# Patient Record
Sex: Female | Born: 1975 | Race: White | Hispanic: No | Marital: Married | Smoking: Current every day smoker
Health system: Southern US, Community
[De-identification: ages and names within clinical notes are randomized; demographics above are authoritative.]

## PROBLEM LIST (undated history)

## (undated) DIAGNOSIS — I10 Essential (primary) hypertension: Secondary | ICD-10-CM

## (undated) DIAGNOSIS — F419 Anxiety disorder, unspecified: Secondary | ICD-10-CM

## (undated) DIAGNOSIS — IMO0001 Reserved for inherently not codable concepts without codable children: Secondary | ICD-10-CM

## (undated) DIAGNOSIS — F329 Major depressive disorder, single episode, unspecified: Secondary | ICD-10-CM

## (undated) DIAGNOSIS — E7800 Pure hypercholesterolemia, unspecified: Secondary | ICD-10-CM

## (undated) DIAGNOSIS — D649 Anemia, unspecified: Secondary | ICD-10-CM

## (undated) DIAGNOSIS — J45909 Unspecified asthma, uncomplicated: Secondary | ICD-10-CM

## (undated) DIAGNOSIS — K219 Gastro-esophageal reflux disease without esophagitis: Secondary | ICD-10-CM

## (undated) DIAGNOSIS — F32A Depression, unspecified: Secondary | ICD-10-CM

## (undated) DIAGNOSIS — E119 Type 2 diabetes mellitus without complications: Secondary | ICD-10-CM

## (undated) DIAGNOSIS — G4700 Insomnia, unspecified: Secondary | ICD-10-CM

## (undated) HISTORY — DX: Gastro-esophageal reflux disease without esophagitis: K21.9

## (undated) HISTORY — DX: Reserved for inherently not codable concepts without codable children: IMO0001

## (undated) HISTORY — DX: Depression, unspecified: F32.A

## (undated) HISTORY — DX: Major depressive disorder, single episode, unspecified: F32.9

## (undated) HISTORY — DX: Unspecified asthma, uncomplicated: J45.909

## (undated) HISTORY — DX: Essential (primary) hypertension: I10

## (undated) HISTORY — DX: Anxiety disorder, unspecified: F41.9

## (undated) HISTORY — DX: Insomnia, unspecified: G47.00

---

## 2002-04-28 ENCOUNTER — Emergency Department (HOSPITAL_COMMUNITY): Admission: EM | Admit: 2002-04-28 | Discharge: 2002-04-28 | Payer: Self-pay | Admitting: Emergency Medicine

## 2002-04-28 ENCOUNTER — Encounter: Payer: Self-pay | Admitting: Emergency Medicine

## 2002-06-09 ENCOUNTER — Ambulatory Visit (HOSPITAL_COMMUNITY): Admission: RE | Admit: 2002-06-09 | Discharge: 2002-06-09 | Payer: Self-pay | Admitting: Family Medicine

## 2002-06-09 ENCOUNTER — Encounter: Payer: Self-pay | Admitting: Family Medicine

## 2003-02-11 ENCOUNTER — Emergency Department (HOSPITAL_COMMUNITY): Admission: EM | Admit: 2003-02-11 | Discharge: 2003-02-11 | Payer: Self-pay | Admitting: Internal Medicine

## 2004-01-13 ENCOUNTER — Emergency Department (HOSPITAL_COMMUNITY): Admission: EM | Admit: 2004-01-13 | Discharge: 2004-01-13 | Payer: Self-pay | Admitting: Emergency Medicine

## 2004-06-04 ENCOUNTER — Inpatient Hospital Stay (HOSPITAL_COMMUNITY): Admission: RE | Admit: 2004-06-04 | Discharge: 2004-06-05 | Payer: Self-pay | Admitting: Obstetrics & Gynecology

## 2006-09-19 ENCOUNTER — Ambulatory Visit (HOSPITAL_COMMUNITY): Admission: AD | Admit: 2006-09-19 | Discharge: 2006-09-19 | Payer: Self-pay | Admitting: Obstetrics and Gynecology

## 2006-10-19 ENCOUNTER — Ambulatory Visit (HOSPITAL_COMMUNITY): Admission: RE | Admit: 2006-10-19 | Discharge: 2006-10-19 | Payer: Self-pay | Admitting: Obstetrics and Gynecology

## 2006-10-27 ENCOUNTER — Inpatient Hospital Stay (HOSPITAL_COMMUNITY): Admission: AD | Admit: 2006-10-27 | Discharge: 2006-10-30 | Payer: Self-pay | Admitting: Obstetrics and Gynecology

## 2006-10-27 ENCOUNTER — Encounter (INDEPENDENT_AMBULATORY_CARE_PROVIDER_SITE_OTHER): Payer: Self-pay | Admitting: Specialist

## 2006-11-04 ENCOUNTER — Ambulatory Visit (HOSPITAL_COMMUNITY): Admission: RE | Admit: 2006-11-04 | Discharge: 2006-11-04 | Payer: Self-pay | Admitting: Obstetrics and Gynecology

## 2007-02-28 ENCOUNTER — Emergency Department (HOSPITAL_COMMUNITY): Admission: EM | Admit: 2007-02-28 | Discharge: 2007-02-28 | Payer: Self-pay | Admitting: Emergency Medicine

## 2011-02-07 NOTE — H&P (Signed)
[name], [name]               ACCOUNT NO.:  [id number]   MEDICAL RECORD NO.:  [id number]          PATIENT TYPE:  INP   LOCATION:  LDR3                          FACILITY:  [facility]   PHYSICIAN:  Tilda Burrow, M.D. DATE OF BIRTH:  1975/10/27   DATE OF ADMISSION:  10/27/2006  DATE OF DISCHARGE:  LH                              HISTORY & PHYSICAL   ADMITTING DIAGNOSES:  1. Pregnancy 37 +30 weeks.  2. Severe preeclampsia.  3. Repeat cesarean section after trial of labor.  4. History of emergency cesarean section in the past for unexplained      IUGR   HISTORY OF PRESENT ILLNESS:  This 35 year old female, gravida 2, para 1,  AB 0, LMP Feb 07, 2006 placing [facility] November 14, 2006.  Has  had 3 ultrasounds which correspond.  She actually had a 33-week  ultrasound which suggests a 2243-gram infant with good fluid volume at  32 weeks.  She is seen in our office today for a routine office visit  and has had blood pressures go dramatically from 122/84 one week ago to  180/96 in the office, with blood pressures on bedrest in the hospital at  163/99 and one 168/97.  She has reflexes that are 4+ reflexes with 3  beats clonus, with both hyperactivity in the upper and lower  extremities.  She has liver function tests which are normal, denies  headaches, scotoma, right upper quadrant __discomfort_.  Platelets  remain normal at 240,000, and liver enzymes are normal and urine protein  was trace in the office.  Nonetheless, with c-section required, we are  going to proceed with cesarean this afternoon.  Her last food was at  8:00 a.m. this morning, where she ate a heavy breakfast biscuit, etc. at  8:00 a.m.  She has been NPO since that time.  Plans will proceed after 6  hours of NPO status to C-section.   PAST MEDICAL HISTORY:  Benign abdomen, asthma not active this pregnancy.   PAST SURGICAL HISTORY:  Cesarean section, dental extraction.   ALLERGIES:  NONE.   SOCIAL HISTORY:   Married, works as a Diplomatic Services operational officer.  Works in Mining engineer.   PHYSICAL EXAMINATION:  VITAL SIGNS:  Height 5 feet 3 inches, estimated  weight 180.  GENERAL:  Alert, oriented x3.  NECK:  Supple.  CHEST:  Clear to auscultation.  ABDOMEN:  36-cm fundal height.  Estimated fetal weight 6 pounds.  Cervix:  Not checked this time.  Previously closed and high.   PRENATAL LABS:  Blood type A positive.  Urine drug screen negative.  Hemoglobin 14, hematocrit 42.  Hepatitis, HIV, RPR, GC and Chlamydia all  negative.  Group B strep not in current record.   PLAN:  Repeat C-section 2:30 p.m. on October 27, 2006.  Magnesium  sulfate is begun at this time.      Tilda Burrow, M.D.  Electronically Signed     JVF/MEDQ  D:  10/27/2006  T:  10/27/2006  Job:  [id number]   cc:   Francoise Schaumann. Milford Cage DO, FAAP  Fax: [phone]

## 2011-02-07 NOTE — Op Note (Signed)
[name], [name]               ACCOUNT NO.:  [id number]   MEDICAL RECORD NO.:  [id number]          PATIENT TYPE:  INP   LOCATION:  A402                          FACILITY:  [facility]   PHYSICIAN:  Tilda Burrow, M.D. DATE OF BIRTH:  09-Jan-1976   DATE OF PROCEDURE:  10/27/2006  DATE OF DISCHARGE:                               OPERATIVE REPORT   PREOPERATIVE DIAGNOSIS:  Pregnancy 37 weeks 3 days, severe pre-  eclampsia, prior cesarean section, not for trial of labor.   POSTOPERATIVE DIAGNOSIS:  Pregnancy 37 weeks 3 days, severe pre-  eclampsia, prior cesarean section, not for trial of labor, delivered.   PROCEDURE:  Repeat low transverse cervical cesarean section.   SURGEON:  Tilda Burrow, M.D.   ASSISTANTAnnabell Howells, R.N.   ANESTHESIA:  Spinal, Minerva Areola, CRNA   COMPLICATIONS:  None.   FINDINGS:  6 pounds 7 ounces female infant, Apgars 9/9, clear amniotic  fluid.   DETAILS OF PROCEDURE:  The patient was taken to the operating room,  prepped and draped with a Foley catheter in place.  A Pfannenstiel type  incision was performed without difficulty with excision of the old  cicatrix.  The fascia was opened transversely and the peritoneal cavity  opened easily.  There was a small amount of omental adhesions to the  anterior abdominal wall from prior surgery.  These were released.  The  bladder flap was developed easily on the lower uterine segment and a  transverse uterine incision made in the lower uterine segment and  extended laterally using index finger traction and then the fetal vertex  guided through the incision with fundal pressure used as the primary  propulsive force.   The uterus was irrigated with antibiotic solution.  The placenta was  delivered intact, Tomasa Blase presentation.  A single layer running locking  closure of the uterine cavity was followed by 2-0 chromic closure of the  bladder flap.  The  anterior abdominal wall was reapproximated layer by layer  with  subcutaneous 2-0 Dexon used to close the subcutaneous space and 0 Dexon  used to close the fascia.  The patient tolerated the procedure well with  JP drain placed in the maternal subcutaneous space and was stable for  discharge in stable condition.      Tilda Burrow, M.D.  Electronically Signed     JVF/MEDQ  D:  10/27/2006  T:  10/27/2006  Job:  [id number]   cc:   Jeoffrey Massed, MD  Fax: [phone]

## 2011-02-07 NOTE — Op Note (Signed)
NAME:  [name], [name]                         ACCOUNT NO.:  [id number]   MEDICAL RECORD NO.:  [id number]                   PATIENT TYPE:  INP   LOCATION:  LDR1                                 FACILITY:  [facility]   PHYSICIAN:  Tilda Burrow, M.D.              DATE OF BIRTH:  September 22, 1976   DATE OF PROCEDURE:  06/04/2004  DATE OF DISCHARGE:                                 OPERATIVE REPORT   PREOPERATIVE DIAGNOSIS:  Pregnancy, [redacted] weeks gestation, intrauterine growth  retardation, uncertain fetal status, oligohydramnios.   POSTOPERATIVE DIAGNOSIS:  Pregnancy, [redacted] weeks gestation, intrauterine growth  retardation, uncertain fetal status, oligohydramnios.   PROCEDURE:  Primary low transverse cervical Cesarean section emergency.   SURGEON:  Tilda Burrow, M.D.   ASSISTANT:  __________ .   ANESTHESIA:  Tyrone Nine, CNRA.   COMPLICATIONS:  None.   ESTIMATED BLOOD LOSS:  400.   CONDITION:  In the recovery room good.   FINDINGS:  Small for gestational age infant, absent amniotic fluid, no  evidence of meconium. Apgars 9 and 9.   INDICATIONS:  A 35 year old female found to have intrauterine birth  retardation and being stabilized for consideration of transfer who on  monitoring showed spontaneously prolonged decelerations, presumed to be  late, admitted for emergency Cesarean section because of __________ .  The  patient was noted to have prolonged decelerations, and decision made by Dr.  Despina Hidden and myself to proceed with emergency Cesarean section with stat  Cesarean section called for patient. The patient was taken promptly to the  operating room as soon as blood had been obtained for CBC, type and cross  and then was rapidly prepped for Cesarean section delivery. After general  anesthesia obtained and endotracheal intubation, a Pfannenstiel type  incision was performed. The fetus was monitored until 3 minutes before the  incision with external monitoring confirming that the fetal  heart rate was  in the normal range immediately preceding the procedure and oxygen was in  place. Pfannenstiel incision was performed without difficulty. Bladder  developed on the well developed lower uterine segment, and a transverse  incision made in the uterus with great care, carefully reaching the level of  the fetal vertex and extending the incision transversely using index finger  traction. The fetal vertex was easily rotated into the incision and  delivered by funda pressure. The majority of the cord was sent with the  infant with Dr. Milford Cage. Cord blood gas was obtained from the base of the  placenta from an arterial sample. The placenta was delivered intact, Tomasa Blase  presentation, with placenta sent for histology. There was no malodor to the  amniotic cavity.   The uterus was irrigated with antibiotic solution. Single layer of running  locking closure performed. Prior to closure of the uterus, we had made note  that the placental attachment in the left cornual area of the uterus  appeared grossly normal to  inspection. The fundus of the uterus was  similarly inspected and found to have normal tubes bilaterally and ovaries  bilaterally.   The closure of the uterus with single layer running locking 0 chromic  followed by bladder flap reapproximation with continuous running 2-0  chromic. Abdominal cavity irrigation with antibiotic containing solution was  followed by closure of the peritoneum with chromic suture, closure of the  fascia with 0 Vicryl suture, and then reapproximation of the subcu fatty  tissue with 2-0 plain sutures and then staple closure of the skin to  complete the procedure. The estimated blood loss was quite low at 400 cc  with patient going to recovery room where she awakened in stable condition.      ___________________________________________                                            Tilda Burrow, M.D.   JVF/MEDQ  D:  06/04/2004  T:  06/04/2004  Job:   [id number]   cc:   Francoise Schaumann. Halm, D.O.  [address]  [place]  [place] [address]  Fax: [phone]

## 2011-02-07 NOTE — Discharge Summary (Signed)
[name], [name]               ACCOUNT NO.:  [id number]   MEDICAL RECORD NO.:  [id number]          PATIENT TYPE:  INP   LOCATION:  A402                          FACILITY:  [facility]   PHYSICIAN:  Tilda Burrow, M.D. DATE OF BIRTH:  04/22/1976   DATE OF ADMISSION:  10/27/2006  DATE OF DISCHARGE:  02/08/2008LH                               DISCHARGE SUMMARY   ADMISSION DIAGNOSES:  1. Severe preeclampsia.  2. Pregnancy at 37 weeks' gestation, for repeat cesarean section, not      for labor.   DISCHARGE DIAGNOSES:  1. Pregnancy at 37 weeks, delivered, repeat cesarean section.  2. Severe preeclampsia, improved.   HISTORY OF PRESENT ILLNESS:  This 35 year old female, gravida 2, para 1,  AB 0, LMP May 19, is admitted after being seen in the office with blood  pressures 163/99 to 168/97, with 4+ reflexes, without headaches,  scotomata, or right upper quadrant pain.  Additionally, the patient had  normal platelets.   HOSPITAL COURSE:  The patient was admitted at 33 weeks' gestation by  multiple criteria.  She had increasing blood pressure from 122/84 one  week ago to 180/96 in the office, with blood pressures 163/99 and 168/96  without sequelae noted in labor and delivery.  Liver function tests were  normal.  No headaches, scotomata or right upper quadrant pain.  The  patient was admitted for C-section this evening after presenting with  dramatically elevated blood pressures to 163/99 to 168/97 with 4+  reflexes.  No headache, scotomata or right upper quadrant pain or  discomfort.  Normal platelets.  Medical history benign with asthma not  active this pregnancy, C-section and a dental extraction the only  surgical histories.   The patient was admitted, placed on magnesium sulfate.  The decision was  made to go ahead and proceed with a repeat cesarean section the day of  admission.  Labs were drawn, showing her blood type B positive.  The  patient was admitted for cesarean section  after magnesium sulfate is  initiated.   Labs upon admission include hemoglobin 10.3, hematocrit 30.4, white  count 11,900, with total protein 5.9, low potassium of 2.4, and  hemoglobin and hematocrit stable.  The patient had a C-section,  delivering a healthy infant without difficulty and was without major  sequelae.   Postoperatively the patient did well with postoperative hemoglobin 10.6,  hematocrit 31.3, actually higher than preop values.  Electrolytes were  within normal limits with potassium 2.4.  The patient has remained  stable, will be discharged home for follow-up in our office.   ADDENDUM:  Interestingly, upon reaching the office we discovered that  [name] was running an occult evidence of renal dysfunction because BUN is  greater than 10 times the creatinine and creatinine levels have been  approximately 1.2, atypical for her young, otherwise healthy age group.      Tilda Burrow, M.D.  Electronically Signed     JVF/MEDQ  D:  11/27/2006  T:  11/27/2006  Job:  [id number]

## 2011-02-07 NOTE — Discharge Summary (Signed)
[name], [name]               ACCOUNT NO.:  [id number]   MEDICAL RECORD NO.:  [id number]          PATIENT TYPE:  INP   LOCATION:  A412                          FACILITY:  [facility]   PHYSICIAN:  Tilda Burrow, M.D. DATE OF BIRTH:  [birth year]   DATE OF ADMISSION:  06/04/2004  DATE OF DISCHARGE:  09/14/2005LH                                 DISCHARGE SUMMARY   ADMITTING DIAGNOSES:  1.  Pregnancy 35 weeks, 2 days.  2.  Severe intrauterine growth restriction.  3.  Severe oligohydramnios.  4.  Unfavorable cervix.  5.  Uncertain fetal status.   DISCHARGE DIAGNOSES:  1.  Pregnancy, 35 weeks, 2 days, delivered.  2.  Severe intrauterine growth restriction.  3.  Severe oligohydramnios.  4.  Uncertain fetal status.   PROCEDURE:  1.  Obstetric ultrasound on June 04, 2004, unsuccessful biophysical      profile on June 04, 2004.  2.  Nonreactive NST.  3.  Positive CST on June 04, 2004.  Primary low-transverse cervical      cesarean section, Dr. Tilda Burrow, June 04, 2004.   HISTORY OF PRESENT ILLNESS:  This 35 year old female, gravida 1, para 0,  admitted at 35 weeks, 2 days by reliable criteria with unremarkable prenatal  care. She has been seen at 32-1/[redacted] weeks gestation.  She had a fundal height  of 31 cm by abdominal exam.  On followup office visit at 35 weeks, 2 days,  she remained at 31 cm, and ultrasound was performed due to size less than  dates revealing an estimated fetal weight of 1500 grams with absence of any  identifiable pockets of amniotic fluid.  Cord was unable to be isolated.  The decision was made to do Doppler-flow studies.  The patient was prepared  for transfer to [facility] for delivery.   PAST MEDICAL HISTORY:  Unfortunately, the patient's transfer was not  immediately possible due to space limitations at [facility], so she was kept on  the monitor.  During her monitoring, she evidence of uncertain fetal status  with recurrent,  prolonged decelerations without adequate beat-to-beat  variability, suggesting spontaneous decelerations.  She was taken therefore  for stat cesarean section.   HOSPITAL COURSE:  The patient's emergency delivery revealed a 2-pound, 10-  ounce infant.  Postoperatively, the patient did amazingly well.  She had  minimal blood loss at the time of delivery.  Placenta was sent to pathology.  The baby was transferred to [facility] for continued care.  The infant  had Apgars of 9 and 9 and was stable on room air, but was transferred for  continued monitoring and supervision.  The maternal hemoglobin was 12,  hematocrit 33 postoperatively.  This compared with admitting hemoglobin of  13 and hematocrit of 36.8.  Maternal blood type was A positive.   Postoperatively, the patient did well, and was stable for discharge at 24  hours to visit her baby, to follow up three days later for incision check  and staple removal in our office.     John   JVF/MEDQ  D:  06/10/2004  T:  06/10/2004  Job:  [id number]

## 2011-02-07 NOTE — H&P (Signed)
NAME:  NADYNE, GARIEPY                         ACCOUNT NO.:  [id number]   MEDICAL RECORD NO.:  [id number]                   PATIENT TYPE:  OIB   LOCATION:  LDR1                                 FACILITY:  [facility]   PHYSICIAN:  Lazaro Arms, M.D.                DATE OF BIRTH:  Feb 06, 1976   DATE OF ADMISSION:  06/04/2004  DATE OF DISCHARGE:                                HISTORY & PHYSICAL   HISTORY OF PRESENT ILLNESS:  [name] is a 35 year old white female, gravida 1,  para 0, estimated date of delivery of July 07, 2004, by sure last  menstrual period and a confirmatory 8 week 4 day crown/rump length,  currently at 35-2/[redacted] weeks gestation.  The patient has had unremarkable  prenatal care up to this point.  She does smoke approximately 10 cigarettes  a day, but otherwise has had a very unremarkable pregnancy.  She presented  to the office yesterday with fundal height of 31.  When she was seen at 32-  1/2 weeks, she had a fundal height of 31 also.  As a result, without any  history of remotely appearing like ruptured membranes, I ordered an  ultrasound for today which reveals an estimated fetal weight of about 1500 g  and no discernible pockets of amniotic fluid.  A Doppler flow study could  not be done due to lack of fluid and just could not isolate any cord at all  to do the study.  As a result, the patient is informed of this as is her  family.  At 35 weeks and no amniotic fluid with an estimated fetal weight of  less than 4 pounds, I think it prudent to transfer the patient to [facility] where a neonatal intensive care unit is available for immediate  significant ventilatory support.  The patient and her family agree with  this.  I talked with Dr. Gavin Potters over at [facility] and he is certainly glad to  accept the patient in transfer.   PAST MEDICAL HISTORY:  Significant for gastrointestinal reflux disease.   PAST SURGICAL HISTORY:  Negative.   PAST OBSTETRICAL HISTORY:  She is  nulliparous.   ALLERGIES:  None.   MEDICATIONS:  Antacids and H2 blockers sporadically throughout the  pregnancy.   REVIEW OF SYSTEMS:  Otherwise negative.   SOCIAL HISTORY:  The patient works at a [employer].  She does smoke  about 10 cigarettes a day.   LABORATORY DATA:  Blood type is A positive.  The urine drug screen was  negative.  Antibody screen was negative.  Rubella is immune.  Hepatitis B  was negative.  HIV was nonreactive.  Serology was nonreactive.  Pap is  normal.  GC and chlamydia were negative.  AFP at 16 weeks was normal.  Her  group B Streptococcus was obtained yesterday and is not back yet, as was a  repeat GC and chlamydia.  Her Glucola was elevated at 161, but the four  values were 82, 163, 147 and 111, given a normal three-hour GTT.  Her last  hemoglobin and hematocrit at 28 weeks was 13 and 40, respectively.   PHYSICAL EXAMINATION:  HEENT:  Unremarkable.  LUNGS:  Clear.  HEART:  Regular rhythm without murmur, regurgitation or gallop.  BREASTS:  Deferred.  ABDOMEN:  Fundal height 31 cm.  PELVIC:  The cervix is long, thick, closed, thick posterior and vertex.  EXTREMITIES:  Warm with no edema.  NEUROLOGIC:  Grossly intact.   IMPRESSION:  1.  Intrauterine pregnancy at 35-2/[redacted] weeks gestation.  2.  Severe intrauterine growth restriction.  3.  Severe oligohydramnios.  4.  Unfavorable cervix.   PLAN:  The patient is transferred to maternal fetal medicine care, Conni Elliot, M.D., at [facility].  There is room for the baby in the  neonatal intensive care unit as well.  The patient and the family understand  and agree with the plan of care.  External fetal monitor here in labor and  delivery has been reassuring with no evidence of any cord compression or  variable decelerations.     ___________________________________________                                         Lazaro Arms, M.D.   Loraine Maple  D:  06/04/2004  T:  06/04/2004  Job:   [id number]

## 2013-01-26 ENCOUNTER — Emergency Department (HOSPITAL_COMMUNITY)
Admission: EM | Admit: 2013-01-26 | Discharge: 2013-01-26 | Disposition: A | Discharge status: Home / Self Care | Payer: 59 | Attending: Emergency Medicine | Admitting: Emergency Medicine

## 2013-01-26 ENCOUNTER — Encounter: Payer: Self-pay | Admitting: *Deleted

## 2013-01-26 ENCOUNTER — Encounter (HOSPITAL_COMMUNITY): Payer: Self-pay

## 2013-01-26 DIAGNOSIS — I10 Essential (primary) hypertension: Secondary | ICD-10-CM | POA: Insufficient documentation

## 2013-01-26 DIAGNOSIS — Y9389 Activity, other specified: Secondary | ICD-10-CM | POA: Insufficient documentation

## 2013-01-26 DIAGNOSIS — Z8719 Personal history of other diseases of the digestive system: Secondary | ICD-10-CM | POA: Insufficient documentation

## 2013-01-26 DIAGNOSIS — IMO0002 Reserved for concepts with insufficient information to code with codable children: Secondary | ICD-10-CM | POA: Insufficient documentation

## 2013-01-26 DIAGNOSIS — J45909 Unspecified asthma, uncomplicated: Secondary | ICD-10-CM | POA: Insufficient documentation

## 2013-01-26 DIAGNOSIS — S46911A Strain of unspecified muscle, fascia and tendon at shoulder and upper arm level, right arm, initial encounter: Secondary | ICD-10-CM

## 2013-01-26 DIAGNOSIS — F3289 Other specified depressive episodes: Secondary | ICD-10-CM | POA: Insufficient documentation

## 2013-01-26 DIAGNOSIS — X500XXA Overexertion from strenuous movement or load, initial encounter: Secondary | ICD-10-CM | POA: Insufficient documentation

## 2013-01-26 DIAGNOSIS — Z79899 Other long term (current) drug therapy: Secondary | ICD-10-CM | POA: Insufficient documentation

## 2013-01-26 DIAGNOSIS — F411 Generalized anxiety disorder: Secondary | ICD-10-CM | POA: Insufficient documentation

## 2013-01-26 DIAGNOSIS — F17200 Nicotine dependence, unspecified, uncomplicated: Secondary | ICD-10-CM | POA: Insufficient documentation

## 2013-01-26 DIAGNOSIS — F329 Major depressive disorder, single episode, unspecified: Secondary | ICD-10-CM | POA: Insufficient documentation

## 2013-01-26 DIAGNOSIS — Y929 Unspecified place or not applicable: Secondary | ICD-10-CM | POA: Insufficient documentation

## 2013-01-26 MED ORDER — CYCLOBENZAPRINE HCL 10 MG PO TABS: 10.0000 mg | ORAL_TABLET | Freq: Two times a day (BID) | ORAL | Status: DC | PRN

## 2013-01-26 MED ORDER — DEXAMETHASONE SODIUM PHOSPHATE 4 MG/ML IJ SOLN
10.0000 mg | Freq: Once | INTRAMUSCULAR | Status: AC
  Administered 2013-01-26: 10 mg via INTRAMUSCULAR
  Filled 2013-01-26: qty 3

## 2013-01-26 MED ORDER — HYDROCODONE-ACETAMINOPHEN 5-325 MG PO TABS: 1.0000 | ORAL_TABLET | ORAL | Status: DC | PRN

## 2013-01-26 MED ORDER — HYDROCODONE-ACETAMINOPHEN 5-325 MG PO TABS
1.0000 | ORAL_TABLET | Freq: Once | ORAL | Status: AC
  Administered 2013-01-26: 1 via ORAL
  Filled 2013-01-26: qty 1

## 2013-01-26 NOTE — ED Provider Notes (Signed)
History     CSN: [id number]  Arrival date & time 01/26/13  1416   First MD Initiated Contact with Patient 01/26/13 1445      Chief Complaint  Patient presents with  . Arm Pain  . Back Pain    (Consider location/radiation/quality/duration/timing/severity/associated sxs/prior treatment) HPI [name] is a 37 y.o. female who presents to the ED with right shoulder pain. The pain started 3 days ago after she flipped her head over to brush her hair and had a sudden onset of sharp pain. The pain increases with movement. The history was provided by the patient.  Past Medical History  Diagnosis Date  . Hypertension   . Reflux   . Depression   . Anxiety   . Asthma   . Insomnia     Past Surgical History  Procedure Laterality Date  . Cesarean section      No family history on file.  History  Substance Use Topics  . Smoking status: Current Every Day Smoker  . Smokeless tobacco: Not on file  . Alcohol Use: No    OB History   Grav Para Term Preterm Abortions TAB SAB Ect Mult Living                  Review of Systems  Constitutional: Negative for fever and chills.  HENT: Negative for neck pain.   Eyes: Negative for visual disturbance.  Respiratory: Negative for cough.   Gastrointestinal: Negative for abdominal pain.  Musculoskeletal: Positive for back pain.       Right shoulder pain  Skin: Negative for wound.  Neurological: Negative for headaches.  Psychiatric/Behavioral: The patient is not nervous/anxious.     Allergies  Review of patient's allergies indicates no known allergies.  Home Medications   Current Outpatient Rx  Name  Route  Sig  Dispense  Refill  . ALPRAZolam (XANAX) 0.5 MG tablet   Oral   Take 0.5 mg by mouth at bedtime as needed for sleep.         Marland Kitchen FLUoxetine (PROZAC) 20 MG tablet   Oral   Take 20 mg by mouth daily.           BP 143/86  Pulse 114  Temp(Src) 98.1 F (36.7 C) (Oral)  Resp 20  Ht 5\' 2"  (1.575 m)  Wt 168 lb  (76.204 kg)  BMI 30.72 kg/m2  SpO2 98%  LMP 01/05/2013  Physical Exam  Nursing note and vitals reviewed. Constitutional: She is oriented to person, place, and time. She appears well-developed and well-nourished. No distress.  HENT:  Head: Normocephalic.  Eyes: EOM are normal.  Neck: Normal range of motion. Neck supple.  Cardiovascular: Normal rate and regular rhythm.   Pulmonary/Chest: Effort normal and breath sounds normal.  Musculoskeletal: Normal range of motion.       Right shoulder: She exhibits tenderness, pain and spasm. She exhibits normal range of motion, no swelling, no effusion, no deformity, no laceration, normal pulse and normal strength.       Arms: Tender on palpation of right shoulder. Full range of motion without difficulty. Radial pulse present and strong. Good touch sensation, adequate circulation.  Neurological: She is alert and oriented to person, place, and time. She has normal strength. No cranial nerve deficit or sensory deficit.  Skin: Skin is warm and dry.  Psychiatric: She has a normal mood and affect. Her behavior is normal. Judgment and thought content normal.    ED Course  Procedures (including critical care  time) Assessment: 37 y.o. female with right shoulder pain    Probably muscle strain  Plan:  Pain management, ice, rest follow up with Ortho, return as needed  MDM  Discussed with the patient clinical findings and need for follow up. All questioned fully answered.   Medication List    TAKE these medications       cyclobenzaprine 10 MG tablet  Commonly known as:  FLEXERIL  Take 1 tablet (10 mg total) by mouth 2 (two) times daily as needed for muscle spasms.     HYDROcodone-acetaminophen 5-325 MG per tablet  Commonly known as:  NORCO/VICODIN  Take 1 tablet by mouth every 4 (four) hours as needed.      ASK your doctor about these medications       ALPRAZolam 0.5 MG tablet  Commonly known as:  XANAX  Take 0.5 mg by mouth at bedtime as  needed for sleep.     FLUoxetine 20 MG tablet  Commonly known as:  PROZAC  Take 20 mg by mouth daily.               Janne Napoleon, [place] 01/26/13 [phone]

## 2013-01-26 NOTE — ED Notes (Signed)
Pt reports Monday she flipped her head over to brush her hair and had sudden onset of sharp pain in r shoulder, breast, arm, and back.  Reports pain is much worse with movement.  Says neither ice or heat helps.

## 2013-01-27 ENCOUNTER — Ambulatory Visit: Payer: Self-pay | Admitting: Family Medicine

## 2013-01-31 NOTE — ED Provider Notes (Signed)
Medical screening examination/treatment/procedure(s) were performed by non-physician practitioner and as supervising physician I was immediately available for consultation/collaboration.   Kristal Perl W. Kyera Felan, MD 01/31/13 0515 

## 2013-02-25 ENCOUNTER — Ambulatory Visit (INDEPENDENT_AMBULATORY_CARE_PROVIDER_SITE_OTHER): Payer: 59 | Admitting: Family Medicine

## 2013-02-25 ENCOUNTER — Encounter: Payer: Self-pay | Admitting: Family Medicine

## 2013-02-25 VITALS — BP 130/88 | HR 80 | Wt 178.0 lb

## 2013-02-25 DIAGNOSIS — M25511 Pain in right shoulder: Secondary | ICD-10-CM

## 2013-02-25 DIAGNOSIS — M25519 Pain in unspecified shoulder: Secondary | ICD-10-CM

## 2013-02-25 MED ORDER — CHLORZOXAZONE 500 MG PO TABS: 500.0000 mg | ORAL_TABLET | Freq: Three times a day (TID) | ORAL | Status: DC | PRN

## 2013-02-25 MED ORDER — DICLOFENAC SODIUM 75 MG PO TBEC: 75.0000 mg | DELAYED_RELEASE_TABLET | Freq: Two times a day (BID) | ORAL | Status: DC

## 2013-02-25 NOTE — Progress Notes (Signed)
  Subjective:    Patient ID: [name], female    DOB: 12-15-1975, 37 y.o.   MRN: [id number]  Shoulder Pain  The pain is present in the right shoulder. This is a recurrent problem. The current episode started more than 1 month ago. The problem occurs constantly. The problem has been gradually worsening. The quality of the pain is described as sharp. The pain is at a severity of 6/10. The pain is moderate. The symptoms are aggravated by activity. She has tried nothing for the symptoms. The treatment provided moderate relief.   In pain extends from the lateral neck into the shoulder and down to the the deltoid region. Worse with certain motions. Feels tight at times. Seen in ER and given prescription for Flexeril and small number of hydrocodone  Review of Systems    no chest pain no abdominal pain no shortness of breath ROS otherwise negative Objective:   Physical Exam  Alert no major distress. HEENT normal. Lungs clear. Heart regular rate and rhythm. Shoulder good range of motion no true impingement sign. Positive pain with rotation. Positive suprascapular tenderness positive deltoid tenderness. Distal grip strength sensation intact. Positive pain with rotation of neck.      Assessment & Plan:  Impression diffuse shoulder girdle strain. Highly doubt intra-articular or cervical disc problems. Discussed. With patient. Plan chlorzoxazone 500 3 times a day. Voltaren 75 twice a day. Local measures. Hydrocodone at night for pain. Symptomatic care discussed. WSL

## 2014-10-30 ENCOUNTER — Encounter: Payer: Self-pay | Admitting: Nurse Practitioner

## 2015-02-12 ENCOUNTER — Encounter: Payer: Self-pay | Admitting: Nurse Practitioner

## 2015-02-12 ENCOUNTER — Ambulatory Visit (INDEPENDENT_AMBULATORY_CARE_PROVIDER_SITE_OTHER): Payer: 59 | Admitting: Nurse Practitioner

## 2015-02-12 VITALS — BP 136/90 | Temp 98.2°F | Ht 62.0 in | Wt 176.1 lb

## 2015-02-12 DIAGNOSIS — J0120 Acute ethmoidal sinusitis, unspecified: Secondary | ICD-10-CM | POA: Diagnosis not present

## 2015-02-12 MED ORDER — METHYLPREDNISOLONE ACETATE 40 MG/ML IJ SUSP
40.0000 mg | Freq: Once | INTRAMUSCULAR | Status: AC
  Administered 2015-02-12: 40 mg via INTRAMUSCULAR

## 2015-02-12 MED ORDER — LEVOFLOXACIN 500 MG PO TABS: 500.0000 mg | ORAL_TABLET | Freq: Every day | ORAL | Status: DC

## 2015-02-12 NOTE — Patient Instructions (Signed)
[place] AQ as directed 

## 2015-02-13 ENCOUNTER — Encounter: Payer: Self-pay | Admitting: Nurse Practitioner

## 2015-02-13 NOTE — Progress Notes (Signed)
Subjective:  Presents for complaints of sore throat and head congestion over the past week and a half. No fever. Ethmoid sinus area headache. Slight runny nose. Cough worse at night. Producing green mucus. Left ear pain more than right. Possible wheezing at nighttime. Reflux is stable. Smokes more than a pack per day.  Objective:   BP 136/90 mmHg  Temp(Src) 98.2 F (36.8 C) (Oral)  Ht 5\' 2"  (1.575 m)  Wt 176 lb 2 oz (79.89 kg)  BMI 32.21 kg/m2  LMP 01/08/2015 NAD. Alert, oriented. TMs retracted, no erythema. Pharynx erythematous with green PND noted. Neck supple with mild soft anterior adenopathy. Lungs clear. Heart regular rate rhythm.  Assessment: Acute ethmoidal sinusitis, recurrence not specified - Plan: methylPREDNISolone acetate (DEPO-MEDROL) injection 40 mg  Plan:  Meds ordered this encounter  Medications  . levofloxacin (LEVAQUIN) 500 MG tablet    Sig: Take 1 tablet (500 mg total) by mouth daily.    Dispense:  10 tablet    Refill:  0    Order Specific Question:  Supervising Provider    Answer:  Merlyn AlbertLUKING, WILLIAM S [[id number]]  . methylPREDNISolone acetate (DEPO-MEDROL) injection 40 mg    Sig:    OTC meds as directed for congestion and cough. Warning signs reviewed. Call back by then the week if no improvement, sooner if worse. Discussed importance of smoking cessation. Strongly recommend preventive health physical.

## 2017-12-07 ENCOUNTER — Ambulatory Visit (INDEPENDENT_AMBULATORY_CARE_PROVIDER_SITE_OTHER): Payer: 59 | Admitting: Nurse Practitioner

## 2017-12-07 ENCOUNTER — Encounter: Payer: Self-pay | Admitting: Nurse Practitioner

## 2017-12-07 VITALS — BP 126/82 | Ht 62.0 in | Wt 178.2 lb

## 2017-12-07 DIAGNOSIS — Z124 Encounter for screening for malignant neoplasm of cervix: Secondary | ICD-10-CM | POA: Diagnosis not present

## 2017-12-07 DIAGNOSIS — Z01419 Encounter for gynecological examination (general) (routine) without abnormal findings: Secondary | ICD-10-CM | POA: Diagnosis not present

## 2017-12-07 DIAGNOSIS — Z1231 Encounter for screening mammogram for malignant neoplasm of breast: Secondary | ICD-10-CM

## 2017-12-07 DIAGNOSIS — Z13220 Encounter for screening for lipoid disorders: Secondary | ICD-10-CM | POA: Diagnosis not present

## 2017-12-07 DIAGNOSIS — Z131 Encounter for screening for diabetes mellitus: Secondary | ICD-10-CM | POA: Diagnosis not present

## 2017-12-07 DIAGNOSIS — Z1151 Encounter for screening for human papillomavirus (HPV): Secondary | ICD-10-CM | POA: Diagnosis not present

## 2017-12-07 DIAGNOSIS — R5383 Other fatigue: Secondary | ICD-10-CM | POA: Diagnosis not present

## 2017-12-08 ENCOUNTER — Encounter: Payer: Self-pay | Admitting: Nurse Practitioner

## 2017-12-08 NOTE — Progress Notes (Signed)
Subjective:    Patient ID: [name], female    DOB: 02-17-1976, 42 y.o.   MRN: [id number]  HPI presents for her wellness exam. Married, same sexual partner. Regular menses, slightly heavy, lasting 4-5 days. Husband has had a vasectomy. Smokes around 1/2 ppd. Has eye exam scheduled next week. Regular dental care. Walking for exercise. Father died on 10/14/2022. States she is handling it well at this point.  Depression screen PHQ 2/9 12/07/2017  Decreased Interest 1  Down, Depressed, Hopeless 1  PHQ - 2 Score 2  Altered sleeping 3  Tired, decreased energy 3  Change in appetite 1  Feeling bad or failure about yourself  1  Trouble concentrating 1  Moving slowly or fidgety/restless 0  Suicidal thoughts 0  PHQ-9 Score 11      Review of Systems  Constitutional: Positive for fatigue. Negative for activity change and appetite change.  HENT: Negative for dental problem, ear pain, sinus pressure and sore throat.   Respiratory: Negative for cough, chest tightness, shortness of breath and wheezing.   Cardiovascular: Negative for chest pain.  Gastrointestinal: Negative for abdominal distention, abdominal pain, blood in stool, constipation, diarrhea, nausea and vomiting.  Genitourinary: Negative for difficulty urinating, dysuria, enuresis, frequency, genital sores, menstrual problem, pelvic pain, urgency and vaginal discharge.  Psychiatric/Behavioral: Positive for sleep disturbance. Negative for self-injury and suicidal ideas.       Recently lost her father unexpectedly       Objective:   Physical Exam  Constitutional: She is oriented to person, place, and time. She appears well-developed. No distress.  HENT:  Right Ear: External ear normal.  Left Ear: External ear normal.  Mouth/Throat: Oropharynx is clear and moist.  Neck: Normal range of motion. Neck supple. No tracheal deviation present. No thyromegaly present.  Cardiovascular: Normal rate, regular rhythm and normal heart sounds. Exam  reveals no gallop.  No murmur heard. Pulmonary/Chest: Effort normal and breath sounds normal. Right breast exhibits no inverted nipple, no mass, no skin change and no tenderness. Left breast exhibits no inverted nipple, no mass, no skin change and no tenderness. Breasts are symmetrical.  Axillae no adenopathy.  Abdominal: Soft. She exhibits no distension. There is no tenderness.  Genitourinary: Vagina normal and uterus normal. No vaginal discharge found.  Genitourinary Comments: External GU: one small external skin tag left labia (patient states it has been there long term with no change); otherwise clear. Vagina: minimal white mucoid discharge. No CMT. Cervix normal in appearance. Bimanual exam: no tenderness or obvious masses.   Musculoskeletal: She exhibits no edema.  Lymphadenopathy:    She has no cervical adenopathy.  Neurological: She is alert and oriented to person, place, and time.  Skin: Skin is warm and dry. No rash noted.  Psychiatric: She has a normal mood and affect. Her behavior is normal. Thought content normal.  Vitals reviewed.         Assessment & Plan:  Well woman exam - Plan: Pap IG and HPV (high risk) DNA detection, Lipid panel, Hepatic function panel, Basic metabolic panel, CBC with Differential/Platelet, TSH, VITAMIN D 25 Hydroxy (Vit-D Deficiency, Fractures)  Screening for cervical cancer - Plan: Pap IG and HPV (high risk) DNA detection  Screening for HPV (human papillomavirus) - Plan: Pap IG and HPV (high risk) DNA detection  Fatigue, unspecified type - Plan: CBC with Differential/Platelet, TSH, VITAMIN D 25 Hydroxy (Vit-D Deficiency, Fractures)  Screening, lipid - Plan: Lipid panel  Screening mammogram, encounter for - Plan: MM  DIGITAL SCREENING BILATERAL  Encounter for screening examination for impaired glucose regulation and diabetes mellitus - Plan: Basic metabolic panel  First mammogram after age 42. Labs pending. Discussed smoking cessation at  length. To call back at anytime if she wants to try Chantix.  Defers need for intervention for bereavement. Call back if assistance is needed. Return in about 1 year (around 12/08/2018) for physical.

## 2017-12-09 DIAGNOSIS — Z01419 Encounter for gynecological examination (general) (routine) without abnormal findings: Secondary | ICD-10-CM | POA: Diagnosis not present

## 2017-12-09 DIAGNOSIS — Z131 Encounter for screening for diabetes mellitus: Secondary | ICD-10-CM | POA: Diagnosis not present

## 2017-12-09 DIAGNOSIS — Z13220 Encounter for screening for lipoid disorders: Secondary | ICD-10-CM | POA: Diagnosis not present

## 2017-12-10 ENCOUNTER — Encounter: Payer: Self-pay | Admitting: Nurse Practitioner

## 2017-12-10 ENCOUNTER — Other Ambulatory Visit: Payer: Self-pay | Admitting: Nurse Practitioner

## 2017-12-10 DIAGNOSIS — D509 Iron deficiency anemia, unspecified: Secondary | ICD-10-CM | POA: Insufficient documentation

## 2017-12-10 DIAGNOSIS — E559 Vitamin D deficiency, unspecified: Secondary | ICD-10-CM | POA: Insufficient documentation

## 2017-12-10 LAB — CBC WITH DIFFERENTIAL/PLATELET
BASOS ABS: 0.1 10*3/uL (ref 0.0–0.2)
Basos: 1 %
EOS (ABSOLUTE): 0.1 10*3/uL (ref 0.0–0.4)
EOS: 2 %
HEMATOCRIT: 33.9 % — AB (ref 34.0–46.6)
Hemoglobin: 9.7 g/dL — ABNORMAL LOW (ref 11.1–15.9)
Immature Grans (Abs): 0 10*3/uL (ref 0.0–0.1)
Immature Granulocytes: 0 %
LYMPHS ABS: 2.3 10*3/uL (ref 0.7–3.1)
Lymphs: 31 %
MCH: 19.4 pg — AB (ref 26.6–33.0)
MCHC: 28.6 g/dL — ABNORMAL LOW (ref 31.5–35.7)
MCV: 68 fL — ABNORMAL LOW (ref 79–97)
MONOS ABS: 0.4 10*3/uL (ref 0.1–0.9)
Monocytes: 6 %
Neutrophils Absolute: 4.6 10*3/uL (ref 1.4–7.0)
Neutrophils: 60 %
Platelets: 273 10*3/uL (ref 150–379)
RBC: 5 x10E6/uL (ref 3.77–5.28)
RDW: 18.2 % — ABNORMAL HIGH (ref 12.3–15.4)
WBC: 7.6 10*3/uL (ref 3.4–10.8)

## 2017-12-10 LAB — BASIC METABOLIC PANEL
BUN / CREAT RATIO: 15 (ref 9–23)
BUN: 13 mg/dL (ref 6–20)
CHLORIDE: 102 mmol/L (ref 96–106)
CO2: 24 mmol/L (ref 20–29)
Calcium: 9.3 mg/dL (ref 8.7–10.2)
Creatinine, Ser: 0.84 mg/dL (ref 0.57–1.00)
GFR calc Af Amer: 101 mL/min/{1.73_m2} (ref >59)
GFR calc non Af Amer: 88 mL/min/{1.73_m2} (ref >59)
GLUCOSE: 200 mg/dL — AB (ref 65–99)
Potassium: 4.4 mmol/L (ref 3.5–5.2)
SODIUM: 137 mmol/L (ref 134–144)

## 2017-12-10 LAB — TSH: TSH: 0.525 u[IU]/mL (ref 0.450–4.500)

## 2017-12-10 LAB — LIPID PANEL
Chol/HDL Ratio: 3.9 ratio (ref 0.0–4.4)
Cholesterol, Total: 179 mg/dL (ref 100–199)
HDL: 46 mg/dL (ref >39)
LDL Calculated: 117 mg/dL — ABNORMAL HIGH (ref 0–99)
TRIGLYCERIDES: 79 mg/dL (ref 0–149)
VLDL Cholesterol Cal: 16 mg/dL (ref 5–40)

## 2017-12-10 LAB — HEPATIC FUNCTION PANEL
ALK PHOS: 76 IU/L (ref 39–117)
ALT: 27 IU/L (ref 0–32)
AST: 28 IU/L (ref 0–40)
Albumin: 4.3 g/dL (ref 3.5–5.5)
BILIRUBIN, DIRECT: 0.1 mg/dL (ref 0.00–0.40)
Bilirubin Total: 0.3 mg/dL (ref 0.0–1.2)
Total Protein: 6.9 g/dL (ref 6.0–8.5)

## 2017-12-10 LAB — VITAMIN D 25 HYDROXY (VIT D DEFICIENCY, FRACTURES): VIT D 25 HYDROXY: 13.8 ng/mL — AB (ref 30.0–100.0)

## 2017-12-10 MED ORDER — VITAMIN D (ERGOCALCIFEROL) 1.25 MG (50000 UNIT) PO CAPS: 50000.0000 [IU] | ORAL_CAPSULE | ORAL | 2 refills | Status: DC

## 2017-12-12 LAB — PAP IG AND HPV HIGH-RISK
HPV, HIGH-RISK: NEGATIVE
PAP SMEAR COMMENT: 0

## 2017-12-15 ENCOUNTER — Ambulatory Visit (HOSPITAL_COMMUNITY)
Admission: RE | Admit: 2017-12-15 | Discharge: 2017-12-15 | Disposition: A | Discharge status: Home / Self Care | Payer: 59 | Source: Ambulatory Visit | Attending: Nurse Practitioner | Admitting: Nurse Practitioner

## 2017-12-15 ENCOUNTER — Encounter: Payer: Self-pay | Admitting: Nurse Practitioner

## 2017-12-15 ENCOUNTER — Ambulatory Visit: Payer: 59 | Admitting: Nurse Practitioner

## 2017-12-15 VITALS — BP 132/86 | Ht 62.0 in | Wt 177.2 lb

## 2017-12-15 DIAGNOSIS — R05 Cough: Secondary | ICD-10-CM | POA: Diagnosis not present

## 2017-12-15 DIAGNOSIS — D509 Iron deficiency anemia, unspecified: Secondary | ICD-10-CM | POA: Diagnosis not present

## 2017-12-15 DIAGNOSIS — E119 Type 2 diabetes mellitus without complications: Secondary | ICD-10-CM | POA: Diagnosis not present

## 2017-12-15 DIAGNOSIS — R5383 Other fatigue: Secondary | ICD-10-CM | POA: Diagnosis not present

## 2017-12-15 DIAGNOSIS — F17200 Nicotine dependence, unspecified, uncomplicated: Secondary | ICD-10-CM | POA: Diagnosis not present

## 2017-12-15 DIAGNOSIS — E559 Vitamin D deficiency, unspecified: Secondary | ICD-10-CM | POA: Diagnosis not present

## 2017-12-15 LAB — POCT GLUCOSE (DEVICE FOR HOME USE): POC Glucose: 316 mg/dl — AB (ref 70–99)

## 2017-12-15 LAB — POCT GLYCOSYLATED HEMOGLOBIN (HGB A1C): Hemoglobin A1C: 5.9

## 2017-12-15 MED ORDER — METFORMIN HCL 500 MG PO TABS: 500.0000 mg | ORAL_TABLET | Freq: Two times a day (BID) | ORAL | 2 refills | Status: DC

## 2017-12-15 MED ORDER — VITAMIN D (ERGOCALCIFEROL) 1.25 MG (50000 UNIT) PO CAPS: 50000.0000 [IU] | ORAL_CAPSULE | ORAL | 2 refills | Status: DC

## 2017-12-15 NOTE — Progress Notes (Signed)
Subjective: Presents to discuss her recent lab results.  Has an eye exam scheduled for today.  Has regular cycles, slightly heavy flow at times lasting 3-4 days.  On her heaviest days changes her tampon about every 4 hours.  Has had extreme fatigue for a long time.  Occasional headache.  Some polydipsia, polyuria and polyphagia. Has noticed restless legs at night.  Smoker.  Denies any unexplained weight loss unusual cough or hemoptysis.  [facility] her father had diabetes and chronic anemia. No family history of colon cancer.   Objective:   BP 132/86   Ht 5\' 2"  (1.575 m)   Wt 177 lb 3.2 oz (80.4 kg)   LMP 11/30/2017   BMI 32.41 kg/m  NAD.  Alert, oriented. Recent Results (from the past 2160 hour(s))  Pap IG and HPV (high risk) DNA detection     Status: Abnormal   Collection Time: 12/07/17  1:49 PM  Result Value Ref Range   DIAGNOSIS: Comment (A)     Comment: EPITHELIAL CELL ABNORMALITY. ATYPICAL SQUAMOUS CELLS OF UNDETERMINED SIGNIFICANCE (ASC-US).    Specimen adequacy: Comment     Comment: Satisfactory for evaluation. No endocervical component is identified.   Clinician Provided ICD10 Comment     Comment: Z01.419 Z12.4 Z11.51    Performed by: Comment     Comment: Suzette Battiest, Cytotechnologist (ASCP)   Electronically signed by: Comment     Comment: Thomos Lemons, MD, Pathologist   PAP Smear Comment .    PATHOLOGIST PROVIDED ICD10: Comment     Comment: R87.610   Note: Comment     Comment: The Pap smear is a screening test designed to aid in the detection of premalignant and malignant conditions of the uterine cervix.  It is not a diagnostic procedure and should not be used as the sole means of detecting cervical cancer.  Both false-positive and false-negative reports do occur.    Test Methodology Comment     Comment: This liquid based ThinPrep(R) pap test was screened with the use of an image guided system.    HPV, high-risk Negative Negative    Comment: This high-risk HPV test  detects thirteen high-risk types (16/18/31/33/35/39/45/51/52/56/58/59/68) without differentiation.   Lipid panel     Status: Abnormal   Collection Time: 12/09/17  8:23 AM  Result Value Ref Range   Cholesterol, Total 179 100 - 199 mg/dL   Triglycerides 79 0 - 149 mg/dL   HDL 46 >16 mg/dL   VLDL Cholesterol Cal 16 5 - 40 mg/dL   LDL Calculated 109 (H) 0 - 99 mg/dL   Chol/HDL Ratio 3.9 0.0 - 4.4 ratio    Comment:                                   T. Chol/HDL Ratio                                             Men  Women                               1/2 Avg.Risk  3.4    3.3  Avg.Risk  5.0    4.4                                2X Avg.Risk  9.6    7.1                                3X Avg.Risk 23.4   11.0   Hepatic function panel     Status: None   Collection Time: 12/09/17  8:23 AM  Result Value Ref Range   Total Protein 6.9 6.0 - 8.5 g/dL   Albumin 4.3 3.5 - 5.5 g/dL   Bilirubin Total 0.3 0.0 - 1.2 mg/dL   Bilirubin, Direct 1.61 0.00 - 0.40 mg/dL   Alkaline Phosphatase 76 39 - 117 IU/L   AST 28 0 - 40 IU/L   ALT 27 0 - 32 IU/L  Basic metabolic panel     Status: Abnormal   Collection Time: 12/09/17  8:23 AM  Result Value Ref Range   Glucose 200 (H) 65 - 99 mg/dL   BUN 13 6 - 20 mg/dL   Creatinine, Ser 0.96 0.57 - 1.00 mg/dL   GFR calc non Af Amer 88 >59 mL/min/1.73   GFR calc Af Amer 101 >59 mL/min/1.73   BUN/Creatinine Ratio 15 9 - 23   Sodium 137 134 - 144 mmol/L   Potassium 4.4 3.5 - 5.2 mmol/L   Chloride 102 96 - 106 mmol/L   CO2 24 20 - 29 mmol/L   Calcium 9.3 8.7 - 10.2 mg/dL  CBC with Differential/Platelet     Status: Abnormal   Collection Time: 12/09/17  8:23 AM  Result Value Ref Range   WBC 7.6 3.4 - 10.8 x10E3/uL   RBC 5.00 3.77 - 5.28 x10E6/uL   Hemoglobin 9.7 (L) 11.1 - 15.9 g/dL   Hematocrit 04.5 (L) 40.9 - 46.6 %   MCV 68 (L) 79 - 97 fL   MCH 19.4 (L) 26.6 - 33.0 pg   MCHC 28.6 (L) 31.5 - 35.7 g/dL   RDW 81.1 (H) 91.4 -  15.4 %   Platelets 273 150 - 379 x10E3/uL   Neutrophils 60 Not Estab. %   Lymphs 31 Not Estab. %   Monocytes 6 Not Estab. %   Eos 2 Not Estab. %   Basos 1 Not Estab. %   Neutrophils Absolute 4.6 1.4 - 7.0 x10E3/uL   Lymphocytes Absolute 2.3 0.7 - 3.1 x10E3/uL   Monocytes Absolute 0.4 0.1 - 0.9 x10E3/uL   EOS (ABSOLUTE) 0.1 0.0 - 0.4 x10E3/uL   Basophils Absolute 0.1 0.0 - 0.2 x10E3/uL   Immature Granulocytes 0 Not Estab. %   Immature Grans (Abs) 0.0 0.0 - 0.1 x10E3/uL  TSH     Status: None   Collection Time: 12/09/17  8:23 AM  Result Value Ref Range   TSH 0.525 0.450 - 4.500 uIU/mL  VITAMIN D 25 Hydroxy (Vit-D Deficiency, Fractures)     Status: Abnormal   Collection Time: 12/09/17  8:23 AM  Result Value Ref Range   Vit D, 25-Hydroxy 13.8 (L) 30.0 - 100.0 ng/mL    Comment: Vitamin D deficiency has been defined by the Institute of Medicine and an Endocrine Society practice guideline as a level of serum 25-OH vitamin D less than 20 ng/mL (1,2). The Endocrine Society went on to further define vitamin D insufficiency as a level between 21  and 29 ng/mL (2). 1. IOM (Institute of Medicine). 2010. Dietary reference    intakes for calcium and D. [place]: The    [employer]. 2. Holick MF, Binkley [place], Bischoff-Ferrari HA, et al.    Evaluation, treatment, and prevention of vitamin D    deficiency: an Endocrine Society clinical practice    guideline. JCEM. 2011 Jul; 96(7):1911-30.   POCT HgB A1C     Status: None   Collection Time: 12/15/17 11:06 AM  Result Value Ref Range   Hemoglobin A1C 5.9   POCT Glucose (Device for Home Use)     Status: Abnormal   Collection Time: 12/15/17 11:19 AM  Result Value Ref Range   Glucose Fasting, POC  70 - 99 mg/dL   POC Glucose 161316 (A) 70 - 99 mg/dl   Reviewed all lab results with patient.  With a fasting sugar 200 and a random sugar of 316, it is unlikely that the point-of-care A1c of 5.9 is accurate.  Assessment:   Problem List  Items Addressed This Visit      Endocrine   Type 2 diabetes mellitus without complication, without long-term current use of insulin (HCC) - Primary   Relevant Medications   metFORMIN (GLUCOPHAGE) 500 MG tablet   Other Relevant Orders   POCT HgB A1C (Completed)   POCT Glucose (Device for Home Use) (Completed)   HgB A1c   Ambulatory referral to diabetic education     Other   Iron deficiency anemia   Relevant Orders   DG Chest 2 View   Ambulatory referral to diabetic education   Vitamin D deficiency   Relevant Orders   Ambulatory referral to diabetic education    Other Visit Diagnoses    Fatigue, unspecified type       Relevant Orders   Ferritin   Iron   DG Chest 2 View   Smoker           Plan:   Meds ordered this encounter  Medications  . Vitamin D, Ergocalciferol, (DRISDOL) 50000 units CAPS capsule    Sig: Take 1 capsule (50,000 Units total) by mouth every 7 (seven) days.    Dispense:  4 capsule    Refill:  2    Order Specific Question:   Supervising Provider    Answer:   Merlyn AlbertLUKING, WILLIAM S [[id number]]  . metFORMIN (GLUCOPHAGE) 500 MG tablet    Sig: Take 1 tablet (500 mg total) by mouth 2 (two) times daily with a meal.    Dispense:  60 tablet    Refill:  2    Order Specific Question:   Supervising Provider    Answer:   Merlyn AlbertLUKING, WILLIAM S [[id number]]   Patient has purchased ferrous sulfate tablets OTC 325 mg.  Recommend she start them with water, no tea or milk.  Recommend diet low in sugar and simple carbs.  Will refer to dietitian for diabetic diet counseling.  Given prescription for machine and supplies, check blood sugar once daily and bring to next visit.  Start metformin once daily with supper and if tolerated increase to twice daily.  Start vitamin D prescription, once this is complete start OTC to maintain.  Lab work pending. PAP shows ASCUS but negative HPV testing. Per ASCCP guidelines, repeat PAP with cotesting in 3 years.  Return in about 1 month (around 01/15/2018) for  recheck.

## 2017-12-15 NOTE — Patient Instructions (Signed)
1. Anemia 2. Sugar  Start Metformin 500 mg with supper (with meal)  After 2 weeks, increase to one twice a day with meals

## 2017-12-16 LAB — HEMOGLOBIN A1C
ESTIMATED AVERAGE GLUCOSE: 160 mg/dL
Hgb A1c MFr Bld: 7.2 % — ABNORMAL HIGH (ref 4.8–5.6)

## 2017-12-16 LAB — FERRITIN: FERRITIN: 6 ng/mL — AB (ref 15–150)

## 2017-12-16 LAB — IRON: Iron: 20 ug/dL — ABNORMAL LOW (ref 27–159)

## 2017-12-17 ENCOUNTER — Encounter: Payer: Self-pay | Admitting: Family Medicine

## 2018-01-13 ENCOUNTER — Encounter (HOSPITAL_COMMUNITY): Payer: Self-pay

## 2018-01-13 ENCOUNTER — Ambulatory Visit (HOSPITAL_COMMUNITY)
Admission: RE | Admit: 2018-01-13 | Discharge: 2018-01-13 | Disposition: A | Discharge status: Home / Self Care | Payer: 59 | Source: Ambulatory Visit | Attending: Nurse Practitioner | Admitting: Nurse Practitioner

## 2018-01-13 DIAGNOSIS — R928 Other abnormal and inconclusive findings on diagnostic imaging of breast: Secondary | ICD-10-CM | POA: Diagnosis not present

## 2018-01-13 DIAGNOSIS — Z1231 Encounter for screening mammogram for malignant neoplasm of breast: Secondary | ICD-10-CM | POA: Diagnosis not present

## 2018-01-14 ENCOUNTER — Other Ambulatory Visit: Payer: Self-pay | Admitting: Family Medicine

## 2018-01-14 DIAGNOSIS — R928 Other abnormal and inconclusive findings on diagnostic imaging of breast: Secondary | ICD-10-CM

## 2018-01-15 ENCOUNTER — Ambulatory Visit: Payer: 59 | Admitting: Nurse Practitioner

## 2018-01-15 ENCOUNTER — Encounter: Payer: Self-pay | Admitting: Nurse Practitioner

## 2018-01-15 VITALS — BP 122/88 | Ht 62.0 in | Wt 168.0 lb

## 2018-01-15 DIAGNOSIS — D509 Iron deficiency anemia, unspecified: Secondary | ICD-10-CM | POA: Diagnosis not present

## 2018-01-15 DIAGNOSIS — E559 Vitamin D deficiency, unspecified: Secondary | ICD-10-CM | POA: Diagnosis not present

## 2018-01-15 DIAGNOSIS — E119 Type 2 diabetes mellitus without complications: Secondary | ICD-10-CM | POA: Diagnosis not present

## 2018-01-15 LAB — POCT HEMOGLOBIN: Hemoglobin: 10.6 g/dL — AB (ref 12.2–16.2)

## 2018-01-15 NOTE — Progress Notes (Signed)
Subjective:  Presents for recheck of diabetes and anemia. FBS averaging 12-140. Only had one higher sugar of 168 after her birthday and Easter. Had one evening sugar of 88. Had one episode of mild hypoglycemia on a recent trip with lots of walking. Relieved with drinking some soda. Has cut back on her portions, particularly carbs. Has cut down from at least 4 regular cokes per day to one that she just sips on. Was not able to see the dietician since she was not in network. Plans to look into this. Taking her vitamin D weekly and her iron daily. Has been working on weight loss and healthy habits.   Objective:   BP 122/88   Ht 5\' 2"  (1.575 m)   Wt 168 lb (76.2 kg)   BMI 30.73 kg/m  NAD. Alert, oriented. Cheerful affect. Lungs clear. Heart RRR.  Results for orders placed or performed in visit on 01/15/18  POCT hemoglobin  Result Value Ref Range   Hemoglobin 10.6 (A) 12.2 - 16.2 g/dL   Improved from 9.7 on 12/09/17.    Assessment:   Problem List Items Addressed This Visit      Endocrine   Type 2 diabetes mellitus without complication, without long-term current use of insulin (HCC) - Primary     Other   Iron deficiency anemia   Relevant Medications   IRON PO   Other Relevant Orders   POCT hemoglobin (Completed)   Vitamin D deficiency       Plan:  Continue healthy habits and current medications. Encouraged continued weight loss. Continue iron supplement for at least 3 more months. At next visit, will address lipids, eye exam and foot exam. Also add low dose statin to regimen. Recheck hemoglobin and A1C at next visit.  Check sugar several times per week and bring log to next visit.  Return in about 3 months (around 04/16/2018) for diabetes check up. Call back sooner if needed.

## 2018-01-26 ENCOUNTER — Ambulatory Visit (HOSPITAL_COMMUNITY)
Admission: RE | Admit: 2018-01-26 | Discharge: 2018-01-26 | Disposition: A | Discharge status: Home / Self Care | Payer: 59 | Source: Ambulatory Visit | Attending: Family Medicine | Admitting: Family Medicine

## 2018-01-26 DIAGNOSIS — N6001 Solitary cyst of right breast: Secondary | ICD-10-CM | POA: Diagnosis not present

## 2018-01-26 DIAGNOSIS — R928 Other abnormal and inconclusive findings on diagnostic imaging of breast: Secondary | ICD-10-CM | POA: Diagnosis not present

## 2018-01-26 DIAGNOSIS — N6489 Other specified disorders of breast: Secondary | ICD-10-CM | POA: Diagnosis not present

## 2018-02-03 ENCOUNTER — Ambulatory Visit: Payer: 59 | Admitting: Nutrition

## 2018-03-12 ENCOUNTER — Other Ambulatory Visit: Payer: Self-pay | Admitting: Nurse Practitioner

## 2018-03-12 DIAGNOSIS — H5213 Myopia, bilateral: Secondary | ICD-10-CM | POA: Diagnosis not present

## 2018-03-12 LAB — HM DIABETES EYE EXAM

## 2018-04-08 ENCOUNTER — Encounter: Payer: Self-pay | Admitting: *Deleted

## 2018-04-16 ENCOUNTER — Ambulatory Visit: Payer: 59 | Admitting: Nurse Practitioner

## 2018-08-12 ENCOUNTER — Ambulatory Visit: Payer: 59 | Admitting: Family Medicine

## 2018-08-12 ENCOUNTER — Encounter: Payer: Self-pay | Admitting: Family Medicine

## 2018-08-12 VITALS — BP 148/94 | Ht 62.0 in | Wt 161.0 lb

## 2018-08-12 DIAGNOSIS — E119 Type 2 diabetes mellitus without complications: Secondary | ICD-10-CM | POA: Diagnosis not present

## 2018-08-12 DIAGNOSIS — R030 Elevated blood-pressure reading, without diagnosis of hypertension: Secondary | ICD-10-CM | POA: Diagnosis not present

## 2018-08-12 DIAGNOSIS — D509 Iron deficiency anemia, unspecified: Secondary | ICD-10-CM | POA: Diagnosis not present

## 2018-08-12 LAB — POCT GLYCOSYLATED HEMOGLOBIN (HGB A1C): HEMOGLOBIN A1C: 4.8 % (ref 4.0–5.6)

## 2018-08-12 MED ORDER — METFORMIN HCL 500 MG PO TABS: ORAL_TABLET | ORAL | 1 refills | Status: DC

## 2018-08-12 NOTE — Progress Notes (Signed)
Subjective:    Patient ID: [name], female    DOB: 1976/07/29, 42 y.o.   MRN: [id number]  HPI Patient is here today to follow up on her chronic health issues. She is diabetic and she takes Metformin 500 mg one po Bid.She does tries to eat better some days are better than others. She does get some exercise,but Lynch as much since it has gotten cold.She also takes an iron pill one daily. She had been on Vit d,but she is Lynch sure if [name],but she is Lynch currently taking it.  Reports home blood sugars fasting 111-121 on a good morning, states if she eats something sweet the night before 140-160. No blood sugars under 80. Denies hypoglycemia symptoms. No paresthesias.   Has been working on diet, Lynch drinking as many soft drinks, cutting back on sweets. Lynch exercising much.  Review of Systems  Constitutional: Negative for fatigue and unexpected weight change.  Respiratory: Negative for shortness of breath.   Cardiovascular: Negative for chest pain.  Endocrine: Negative for polydipsia, polyphagia and polyuria.       Results for orders placed or performed in visit on 08/12/18  POCT glycosylated hemoglobin (Hb A1C)  Result Value Ref Range   Hemoglobin A1C 4.8 4.0 - 5.6 %   HbA1c POC (<> result, manual entry)     HbA1c, POC (prediabetic range)     HbA1c, POC (controlled diabetic range)      Objective:   Physical Exam  Constitutional: She is oriented to person, place, and time. She appears well-developed and well-nourished. No distress.  HENT:  Head: Normocephalic and atraumatic.  Neck: Neck supple. Carotid bruit is Lynch present.  Cardiovascular: Normal rate, regular rhythm and normal heart sounds.  No murmur heard. Pulmonary/Chest: Effort normal and breath sounds normal. No respiratory distress.  Neurological: She is alert and oriented to person, place, and time.  Skin: Skin is warm and dry.  Psychiatric: She has a normal mood and affect.  Nursing note  and vitals reviewed.     Assessment & Plan:  1. Type 2 diabetes mellitus without complication, without long-term current use of insulin (HCC) - Plan: POCT glycosylated hemoglobin (Hb A1C) The patient was seen today as part of a comprehensive visit for diabetes. Discussed the importance of keeping A1c at or below 7, as well as the importance of adherence to medication as prescribed.   Encouraged regular physical activity as well as a controlled low starch/sugar diet.   Standard follow-up visit recommended.  Patient aware that failure to keep diabetes under control increases the risk of complications.  A1C under too tight control at 4.8 today. Recommend decreasing metformin to 1 tablet daily. Will f/u in 6 months with recheck on lab work at that time. Encouraged continued efforts at diet and exercise.  2. Iron deficiency anemia, unspecified iron deficiency anemia type Will recheck CBC and ferritin. Continue daily iron supplement. Will notify of results.  3. Elevated blood pressure reading in office without diagnosis of hypertension Pt denies hx of elevated BP readings. Taking daily allergy/sinus decongestant medication for a long time. Recommended keeping track of her BPs at home over the next few weeks and updating us with the readings in a couple weeks. F/u in about 3 months to recheck, if still high at that time will need to start on medication to lower. Pt verbalized understanding.  Dr. Lubertha SouthSteve Luking was consulted on this case and is in agreement with the above  treatment plan.

## 2018-08-12 NOTE — Patient Instructions (Signed)
DASH Eating Plan DASH stands for "Dietary Approaches to Stop Hypertension." The DASH eating plan is a healthy eating plan that has been shown to reduce high blood pressure (hypertension). It may also reduce your risk for type 2 diabetes, heart disease, and stroke. The DASH eating plan may also help with weight loss. What are tips for following this plan? General guidelines  Avoid eating more than 2,300 mg (milligrams) of salt (sodium) a day. If you have hypertension, you may need to reduce your sodium intake to 1,500 mg a day.  Limit alcohol intake to no more than 1 drink a day for nonpregnant women and 2 drinks a day for men. One drink equals 12 oz of beer, 5 oz of wine, or 1 oz of hard liquor.  Work with your health care provider to maintain a healthy body weight or to lose weight. Ask what an ideal weight is for you.  Get at least 30 minutes of exercise that causes your heart to beat faster (aerobic exercise) most days of the week. Activities may include walking, swimming, or biking.  Work with your health care provider or diet and nutrition specialist (dietitian) to adjust your eating plan to your individual calorie needs. Reading food labels  Check food labels for the amount of sodium per serving. Choose foods with less than 5 percent of the Daily Value of sodium. Generally, foods with less than 300 mg of sodium per serving fit into this eating plan.  To find whole grains, look for the word "whole" as the first word in the ingredient list. Shopping  Buy products labeled as "low-sodium" or "no salt added."  Buy fresh foods. Avoid canned foods and premade or frozen meals. Cooking  Avoid adding salt when cooking. Use salt-free seasonings or herbs instead of table salt or sea salt. Check with your health care provider or pharmacist before using salt substitutes.  Do not fry foods. Cook foods using healthy methods such as baking, boiling, grilling, and broiling instead.  Cook with  heart-healthy oils, such as olive, canola, soybean, or sunflower oil. Meal planning   Eat a balanced diet that includes: ? 5 or more servings of fruits and vegetables each day. At each meal, try to fill half of your plate with fruits and vegetables. ? Up to 6-8 servings of whole grains each day. ? Less than 6 oz of lean meat, poultry, or fish each day. A 3-oz serving of meat is about the same size as a deck of cards. One egg equals 1 oz. ? 2 servings of low-fat dairy each day. ? A serving of nuts, seeds, or beans 5 times each week. ? Heart-healthy fats. Healthy fats called Omega-3 fatty acids are found in foods such as flaxseeds and coldwater fish, like sardines, salmon, and mackerel.  Limit how much you eat of the following: ? Canned or prepackaged foods. ? Food that is high in trans fat, such as fried foods. ? Food that is high in saturated fat, such as fatty meat. ? Sweets, desserts, sugary drinks, and other foods with added sugar. ? Full-fat dairy products.  Do not salt foods before eating.  Try to eat at least 2 vegetarian meals each week.  Eat more home-cooked food and less restaurant, buffet, and fast food.  When eating at a restaurant, ask that your food be prepared with less salt or no salt, if possible. What foods are recommended? The items listed may not be a complete list. Talk with your dietitian about what   dietary choices are best for you. Grains Whole-grain or whole-wheat bread. Whole-grain or whole-wheat pasta. Brown rice. Oatmeal. Quinoa. Bulgur. Whole-grain and low-sodium cereals. Pita bread. Low-fat, low-sodium crackers. Whole-wheat flour tortillas. Vegetables Fresh or frozen vegetables (raw, steamed, roasted, or grilled). Low-sodium or reduced-sodium tomato and vegetable juice. Low-sodium or reduced-sodium tomato sauce and tomato paste. Low-sodium or reduced-sodium canned vegetables. Fruits All fresh, dried, or frozen fruit. Canned fruit in natural juice (without  added sugar). Meat and other protein foods Skinless chicken or turkey. Ground chicken or turkey. Pork with fat trimmed off. Fish and seafood. Egg whites. Dried beans, peas, or lentils. Unsalted nuts, nut butters, and seeds. Unsalted canned beans. Lean cuts of beef with fat trimmed off. Low-sodium, lean deli meat. Dairy Low-fat (1%) or fat-free (skim) milk. Fat-free, low-fat, or reduced-fat cheeses. Nonfat, low-sodium ricotta or cottage cheese. Low-fat or nonfat yogurt. Low-fat, low-sodium cheese. Fats and oils Soft margarine without trans fats. Vegetable oil. Low-fat, reduced-fat, or light mayonnaise and salad dressings (reduced-sodium). Canola, safflower, olive, soybean, and sunflower oils. Avocado. Seasoning and other foods Herbs. Spices. Seasoning mixes without salt. Unsalted popcorn and pretzels. Fat-free sweets. What foods are not recommended? The items listed may not be a complete list. Talk with your dietitian about what dietary choices are best for you. Grains Baked goods made with fat, such as croissants, muffins, or some breads. Dry pasta or rice meal packs. Vegetables Creamed or fried vegetables. Vegetables in a cheese sauce. Regular canned vegetables (not low-sodium or reduced-sodium). Regular canned tomato sauce and paste (not low-sodium or reduced-sodium). Regular tomato and vegetable juice (not low-sodium or reduced-sodium). Pickles. Olives. Fruits Canned fruit in a light or heavy syrup. Fried fruit. Fruit in cream or butter sauce. Meat and other protein foods Fatty cuts of meat. Ribs. Fried meat. Bacon. Sausage. Bologna and other processed lunch meats. Salami. Fatback. Hotdogs. Bratwurst. Salted nuts and seeds. Canned beans with added salt. Canned or smoked fish. Whole eggs or egg yolks. Chicken or turkey with skin. Dairy Whole or 2% milk, cream, and half-and-half. Whole or full-fat cream cheese. Whole-fat or sweetened yogurt. Full-fat cheese. Nondairy creamers. Whipped toppings.  Processed cheese and cheese spreads. Fats and oils Butter. Stick margarine. Lard. Shortening. Ghee. Bacon fat. Tropical oils, such as coconut, palm kernel, or palm oil. Seasoning and other foods Salted popcorn and pretzels. Onion salt, garlic salt, seasoned salt, table salt, and sea salt. Worcestershire sauce. Tartar sauce. Barbecue sauce. Teriyaki sauce. Soy sauce, including reduced-sodium. Steak sauce. Canned and packaged gravies. Fish sauce. Oyster sauce. Cocktail sauce. Horseradish that you find on the shelf. Ketchup. Mustard. Meat flavorings and tenderizers. Bouillon cubes. Hot sauce and Tabasco sauce. Premade or packaged marinades. Premade or packaged taco seasonings. Relishes. Regular salad dressings. Where to find more information:  National Heart, Lung, and [employer] Institute: www.nhlbi.nih.gov  American Heart Association: www.heart.org Summary  The DASH eating plan is a healthy eating plan that has been shown to reduce high blood pressure (hypertension). It may also reduce your risk for type 2 diabetes, heart disease, and stroke.  With the DASH eating plan, you should limit salt (sodium) intake to 2,300 mg a day. If you have hypertension, you may need to reduce your sodium intake to 1,500 mg a day.  When on the DASH eating plan, aim to eat more fresh fruits and vegetables, whole grains, lean proteins, low-fat dairy, and heart-healthy fats.  Work with your health care provider or diet and nutrition specialist (dietitian) to adjust your eating plan to your individual   calorie needs. This information is not intended to replace advice given to you by your health care provider. Make sure you discuss any questions you have with your health care provider. Document Released: 08/28/2011 Document Revised: 09/01/2016 Document Reviewed: 09/01/2016 Else[employer] Interactive Patient Education  2018 [employer]  

## 2018-08-13 LAB — CBC WITH DIFFERENTIAL/PLATELET
BASOS: 1 %
Basophils Absolute: 0.1 10*3/uL (ref 0.0–0.2)
EOS (ABSOLUTE): 0.2 10*3/uL (ref 0.0–0.4)
EOS: 2 %
HEMATOCRIT: 48.3 % — AB (ref 34.0–46.6)
Hemoglobin: 16.4 g/dL — ABNORMAL HIGH (ref 11.1–15.9)
Immature Grans (Abs): 0 10*3/uL (ref 0.0–0.1)
Immature Granulocytes: 0 %
LYMPHS ABS: 3.6 10*3/uL — AB (ref 0.7–3.1)
Lymphs: 34 %
MCH: 30.5 pg (ref 26.6–33.0)
MCHC: 34 g/dL (ref 31.5–35.7)
MCV: 90 fL (ref 79–97)
MONOS ABS: 0.8 10*3/uL (ref 0.1–0.9)
Monocytes: 7 %
NEUTROS ABS: 6 10*3/uL (ref 1.4–7.0)
Neutrophils: 56 %
Platelets: 210 10*3/uL (ref 150–450)
RBC: 5.38 x10E6/uL — ABNORMAL HIGH (ref 3.77–5.28)
RDW: 13.3 % (ref 12.3–15.4)
WBC: 10.7 10*3/uL (ref 3.4–10.8)

## 2018-08-13 LAB — MICROALBUMIN / CREATININE URINE RATIO
Creatinine, Urine: 344.6 mg/dL
Microalb/Creat Ratio: 23.2 mg/g creat (ref 0.0–30.0)
Microalbumin, Urine: 80 ug/mL

## 2018-08-13 LAB — FERRITIN: Ferritin: 20 ng/mL (ref 15–150)

## 2018-11-05 ENCOUNTER — Other Ambulatory Visit: Payer: Self-pay

## 2018-11-05 MED ORDER — METFORMIN HCL 500 MG PO TABS: ORAL_TABLET | ORAL | 1 refills | Status: DC

## 2018-11-10 ENCOUNTER — Encounter: Payer: Self-pay | Admitting: Family Medicine

## 2018-11-10 ENCOUNTER — Ambulatory Visit: Payer: 59 | Admitting: Family Medicine

## 2018-11-10 VITALS — BP 136/90 | Ht 62.0 in | Wt 161.2 lb

## 2018-11-10 DIAGNOSIS — R030 Elevated blood-pressure reading, without diagnosis of hypertension: Secondary | ICD-10-CM | POA: Diagnosis not present

## 2018-11-10 NOTE — Progress Notes (Signed)
   Subjective:    Patient ID: [name], female    DOB: 1975/12/18, 43 y.o.   MRN: [id number]  HPI  Patient arrives for a follow up on elevated blood pressure. Patient states she is doing well.  Has not checked BP outside of office. Smoking 1/2 ppd. Does take an allergy decongestant med almost every morning.   Review of Systems  Constitutional: Negative for fever and unexpected weight change.  Eyes: Negative for visual disturbance.  Respiratory: Negative for shortness of breath.   Cardiovascular: Negative for chest pain and leg swelling.  Gastrointestinal: Negative for abdominal pain.  Neurological: Negative for headaches.       Objective:   Physical Exam Vitals signs and nursing note reviewed.  Constitutional:      General: She is not in acute distress.    Appearance: She is well-developed.  HENT:     Head: Normocephalic and atraumatic.  Neck:     Musculoskeletal: Neck supple.  Cardiovascular:     Rate and Rhythm: Normal rate and regular rhythm.     Heart sounds: Normal heart sounds. No murmur.  Pulmonary:     Effort: Pulmonary effort is normal. No respiratory distress.     Breath sounds: Normal breath sounds.  Musculoskeletal:     Right lower leg: No edema.     Left lower leg: No edema.  Skin:    General: Skin is warm and dry.  Neurological:     Mental Status: She is alert and oriented to person, place, and time.  Psychiatric:        Behavior: Behavior normal.           Assessment & Plan:   Elevated blood pressure reading  Pt with elevated BP reading at last OV and today, borderline for needing to start medication. Pt wants to work on lifestyle factors before initiating medications. Discussed importance of smoking cessation, decreasing sodium intake, and increasing regular exercise. Handout given on DASH diet. She states she will work on these things. Recommend f/u in 3 months for recheck of her T2DM and elevated BP. Discussed if still elevated at that  time will need to start medication to help control. Pt verbalized understanding.

## 2018-11-10 NOTE — Patient Instructions (Addendum)
Vitamin D supplement daily - 1000 to 2000 IU daily; Can stop iron supplement but continue regular multivitamin daily  DASH Eating Plan DASH stands for "Dietary Approaches to Stop Hypertension." The DASH eating plan is a healthy eating plan that has been shown to reduce high blood pressure (hypertension). It may also reduce your risk for type 2 diabetes, heart disease, and stroke. The DASH eating plan may also help with weight loss. What are tips for following this plan?  General guidelines  Avoid eating more than 2,300 mg (milligrams) of salt (sodium) a day. If you have hypertension, you may need to reduce your sodium intake to 1,500 mg a day.  Limit alcohol intake to no more than 1 drink a day for nonpregnant women and 2 drinks a day for men. One drink equals 12 oz of beer, 5 oz of wine, or 1 oz of hard liquor.  Work with your health care provider to maintain a healthy body weight or to lose weight. Ask what an ideal weight is for you.  Get at least 30 minutes of exercise that causes your heart to beat faster (aerobic exercise) most days of the week. Activities may include walking, swimming, or biking.  Work with your health care provider or diet and nutrition specialist (dietitian) to adjust your eating plan to your individual calorie needs. Reading food labels   Check food labels for the amount of sodium per serving. Choose foods with less than 5 percent of the Daily Value of sodium. Generally, foods with less than 300 mg of sodium per serving fit into this eating plan.  To find whole grains, look for the word "whole" as the first word in the ingredient list. Shopping  Buy products labeled as "low-sodium" or "no salt added."  Buy fresh foods. Avoid canned foods and premade or frozen meals. Cooking  Avoid adding salt when cooking. Use salt-free seasonings or herbs instead of table salt or sea salt. Check with your health care provider or pharmacist before using salt  substitutes.  Do not fry foods. Cook foods using healthy methods such as baking, boiling, grilling, and broiling instead.  Cook with heart-healthy oils, such as olive, canola, soybean, or sunflower oil. Meal planning  Eat a balanced diet that includes: ? 5 or more servings of fruits and vegetables each day. At each meal, try to fill half of your plate with fruits and vegetables. ? Up to 6-8 servings of whole grains each day. ? Less than 6 oz of lean meat, poultry, or fish each day. A 3-oz serving of meat is about the same size as a deck of cards. One egg equals 1 oz. ? 2 servings of low-fat dairy each day. ? A serving of nuts, seeds, or beans 5 times each week. ? Heart-healthy fats. Healthy fats called Omega-3 fatty acids are found in foods such as flaxseeds and coldwater fish, like sardines, salmon, and mackerel.  Limit how much you eat of the following: ? Canned or prepackaged foods. ? Food that is high in trans fat, such as fried foods. ? Food that is high in saturated fat, such as fatty meat. ? Sweets, desserts, sugary drinks, and other foods with added sugar. ? Full-fat dairy products.  Do not salt foods before eating.  Try to eat at least 2 vegetarian meals each week.  Eat more home-cooked food and less restaurant, buffet, and fast food.  When eating at a restaurant, ask that your food be prepared with less salt or no salt,  if possible. What foods are recommended? The items listed may not be a complete list. Talk with your dietitian about what dietary choices are best for you. Grains Whole-grain or whole-wheat bread. Whole-grain or whole-wheat pasta. Brown rice. [name]. Bulgur. Whole-grain and low-sodium cereals. Pita bread. Low-fat, low-sodium crackers. Whole-wheat flour tortillas. Vegetables Fresh or frozen vegetables (raw, steamed, roasted, or grilled). Low-sodium or reduced-sodium tomato and vegetable juice. Low-sodium or reduced-sodium tomato sauce and tomato  paste. Low-sodium or reduced-sodium canned vegetables. Fruits All fresh, dried, or frozen fruit. Canned fruit in natural juice (without added sugar). Meat and other protein foods Skinless chicken or [place]. Ground chicken or [place]. Pork with fat trimmed off. Fish and seafood. Egg whites. Dried beans, peas, or lentils. Unsalted nuts, nut butters, and seeds. Unsalted canned beans. Lean cuts of beef with fat trimmed off. Low-sodium, lean deli meat. Dairy Low-fat (1%) or fat-free (skim) milk. Fat-free, low-fat, or reduced-fat cheeses. Nonfat, low-sodium ricotta or cottage cheese. Low-fat or nonfat yogurt. Low-fat, low-sodium cheese. Fats and oils Soft margarine without trans fats. Vegetable oil. Low-fat, reduced-fat, or light mayonnaise and salad dressings (reduced-sodium). Canola, safflower, olive, soybean, and sunflower oils. Avocado. Seasoning and other foods Herbs. Spices. Seasoning mixes without salt. Unsalted popcorn and pretzels. Fat-free sweets. What foods are not recommended? The items listed may not be a complete list. Talk with your dietitian about what dietary choices are best for you. Grains Baked goods made with fat, such as croissants, muffins, or some breads. Dry pasta or rice meal packs. Vegetables Creamed or fried vegetables. Vegetables in a cheese sauce. Regular canned vegetables (not low-sodium or reduced-sodium). Regular canned tomato sauce and paste (not low-sodium or reduced-sodium). Regular tomato and vegetable juice (not low-sodium or reduced-sodium). [name]. Olives. Fruits Canned fruit in a light or heavy syrup. Fried fruit. Fruit in cream or butter sauce. Meat and other protein foods Fatty cuts of meat. Ribs. Fried meat. [name]. Sausage. Bologna and other processed lunch meats. Salami. Fatback. Hotdogs. Bratwurst. Salted nuts and seeds. Canned beans with added salt. Canned or smoked fish. Whole eggs or egg yolks. Chicken or [place] with skin. Dairy Whole or 2% milk,  cream, and half-and-half. Whole or full-fat cream cheese. Whole-fat or sweetened yogurt. Full-fat cheese. Nondairy creamers. Whipped toppings. Processed cheese and cheese spreads. Fats and oils Butter. Stick margarine. Lard. Shortening. Ghee. Bacon fat. Tropical oils, such as coconut, palm kernel, or palm oil. Seasoning and other foods Salted popcorn and pretzels. Onion salt, garlic salt, seasoned salt, table salt, and sea salt. Worcestershire sauce. Tartar sauce. Barbecue sauce. Teriyaki sauce. Soy sauce, including reduced-sodium. Steak sauce. Canned and packaged gravies. Fish sauce. Oyster sauce. Cocktail sauce. Horseradish that you find on the shelf. Ketchup. Mustard. Meat flavorings and tenderizers. Bouillon cubes. Hot sauce and Tabasco sauce. Premade or packaged marinades. Premade or packaged taco seasonings. Relishes. Regular salad dressings. Where to find more information:  National Heart, Lung, and [place]: https://wilson-eaton.com/  American Heart Association: www.heart.org Summary  The DASH eating plan is a healthy eating plan that has been shown to reduce high blood pressure (hypertension). It may also reduce your risk for type 2 diabetes, heart disease, and stroke.  With the DASH eating plan, you should limit salt (sodium) intake to 2,300 mg a day. If you have hypertension, you may need to reduce your sodium intake to 1,500 mg a day.  When on the DASH eating plan, aim to eat more fresh fruits and vegetables, whole grains, lean proteins, low-fat dairy, and heart-healthy fats.  Work with your health care provider or diet and nutrition specialist (dietitian) to adjust your eating plan to your individual calorie needs. This information is not intended to replace advice given to you by your health care provider. Make sure you discuss any questions you have with your health care provider. Document Released: 08/28/2011 Document Revised: 09/01/2016 Document Reviewed: 09/01/2016 Elsevier  Interactive Patient Education  2019 [employer]  Steps to Quit Smoking  Smoking tobacco can be harmful to your health and can affect almost every organ in your body. Smoking puts you, and those around you, at risk for developing many serious chronic diseases. Quitting smoking is difficult, but it is one of the best things that you can do for your health. It is never too late to quit. What are the benefits of quitting smoking? When you quit smoking, you lower your risk of developing serious diseases and conditions, such as:  Lung cancer or lung disease, such as COPD.  Heart disease.  Stroke.  Heart attack.  Infertility.  Osteoporosis and bone fractures. Additionally, symptoms such as coughing, wheezing, and shortness of breath may get better when you quit. You may also find that you get sick less often because your body is stronger at fighting off colds and infections. If you are pregnant, quitting smoking can help to reduce your chances of having a baby of low birth weight. How do I get ready to quit? When you decide to quit smoking, create a plan to make sure that you are successful. Before you quit:  Pick a date to quit. Set a date within the next two weeks to give you time to prepare.  Write down the reasons why you are quitting. Keep this list in places where you will see it often, such as on your bathroom mirror or in your car or wallet.  Identify the people, places, things, and activities that make you want to smoke (triggers) and avoid them. Make sure to take these actions: ? Throw away all cigarettes at home, at work, and in your car. ? Throw away smoking accessories, such as Set designerashtrays and lighters. ? Clean your car and make sure to empty the ashtray. ? Clean your home, including curtains and carpets.  Tell your family, friends, and coworkers that you are quitting. Support from your loved ones can make quitting easier.  Talk with your health care provider about your  options for quitting smoking.  Find out what treatment options are covered by your health insurance. What strategies can I use to quit smoking? Talk with your healthcare provider about different strategies to quit smoking. Some strategies include:  Quitting smoking altogether instead of gradually lessening how much you smoke over a period of time. Research shows that quitting "cold [place]key" is more successful than gradually quitting.  Attending in-person counseling to help you build problem-solving skills. You are more likely to have success in quitting if you attend several counseling sessions. Even short sessions of 10 minutes can be effective.  Finding resources and support systems that can help you to quit smoking and remain smoke-free after you quit. These resources are most helpful when you use them often. They can include: ? Online chats with a Veterinary surgeoncounselor. ? Telephone quitlines. ? Automotive engineerrinted self-help materials. ? Support groups or group counseling. ? Text messaging programs. ? Mobile phone applications.  Taking medicines to help you quit smoking. (If you are pregnant or breastfeeding, talk with your health care provider first.) Some medicines contain nicotine and some do not.  Both types of medicines help with cravings, but the medicines that include nicotine help to relieve withdrawal symptoms. Your health care provider may recommend: ? Nicotine patches, gum, or lozenges. ? Nicotine inhalers or sprays. ? Non-nicotine medicine that is taken by mouth. Talk with your health care provider about combining strategies, such as taking medicines while you are also receiving in-person counseling. Using these two strategies together makes you more likely to succeed in quitting than if you used either strategy on its own. If you are pregnant or breastfeeding, talk with your health care provider about finding counseling or other support strategies to quit smoking. Do not take medicine to help you quit  smoking unless told to do so by your health care provider. What things can I do to make it easier to quit? Quitting smoking might feel overwhelming at first, but there is a lot that you can do to make it easier. Take these important actions:  Reach out to your family and friends and ask that they support and encourage you during this time. Call telephone quitlines, reach out to support groups, or work with a counselor for support.  Ask people who smoke to avoid smoking around you.  Avoid places that trigger you to smoke, such as bars, parties, or smoke-break areas at work.  Spend time around people who do not smoke.  Lessen stress in your life, because stress can be a smoking trigger for some people. To lessen stress, try: ? Exercising regularly. ? Deep-breathing exercises. ? Yoga. ? Meditating. ? Performing a body scan. This involves closing your eyes, scanning your body from head to toe, and noticing which parts of your body are particularly tense. Purposefully relax the muscles in those areas.  Download or purchase mobile phone or tablet apps (applications) that can help you stick to your quit plan by providing reminders, tips, and encouragement. There are many free apps, such as QuitGuide from the [employer] developer(Centers for Disease Control and Prevention). You can find other support for quitting smoking (smoking cessation) through smokefree.gov and other websites. How will I feel when I quit smoking? Within the first 24 hours of quitting smoking, you may start to feel some withdrawal symptoms. These symptoms are usually most noticeable 2-3 days after quitting, but they usually do not last beyond 2-3 weeks. Changes or symptoms that you might experience include:  Mood swings.  Restlessness, anxiety, or irritation.  Difficulty concentrating.  Dizziness.  Strong cravings for sugary foods in addition to nicotine.  Mild weight gain.  Constipation.  Nausea.  Coughing or a sore  throat.  Changes in how your medicines work in your body.  A depressed mood.  Difficulty sleeping (insomnia). After the first 2-3 weeks of quitting, you may start to notice more positive results, such as:  Improved sense of smell and taste.  Decreased coughing and sore throat.  Slower heart rate.  Lower blood pressure.  Clearer skin.  The ability to breathe more easily.  Fewer sick days. Quitting smoking is very challenging for most people. Do not get discouraged if you are not successful the first time. Some people need to make many attempts to quit before they achieve long-term success. Do your best to stick to your quit plan, and talk with your health care provider if you have any questions or concerns. This information is not intended to replace advice given to you by your health care provider. Make sure you discuss any questions you have with your health care provider. Document Released:  09/02/2001 Document Revised: 04/14/2017 Document Reviewed: 01/23/2015 Elsevier Interactive Patient Education  [employer].

## 2019-02-09 ENCOUNTER — Ambulatory Visit: Payer: 59 | Admitting: Family Medicine

## 2019-03-07 ENCOUNTER — Other Ambulatory Visit (HOSPITAL_COMMUNITY): Payer: Self-pay | Admitting: Family Medicine

## 2019-03-07 DIAGNOSIS — Z1231 Encounter for screening mammogram for malignant neoplasm of breast: Secondary | ICD-10-CM

## 2019-03-16 ENCOUNTER — Other Ambulatory Visit: Payer: Self-pay

## 2019-03-16 ENCOUNTER — Ambulatory Visit (HOSPITAL_COMMUNITY)
Admission: RE | Admit: 2019-03-16 | Discharge: 2019-03-16 | Disposition: A | Discharge status: Home / Self Care | Payer: 59 | Source: Ambulatory Visit | Attending: Family Medicine | Admitting: Family Medicine

## 2019-03-16 DIAGNOSIS — Z1231 Encounter for screening mammogram for malignant neoplasm of breast: Secondary | ICD-10-CM

## 2019-08-31 ENCOUNTER — Telehealth: Payer: Self-pay | Admitting: Family Medicine

## 2019-08-31 MED ORDER — METFORMIN HCL 500 MG PO TABS: ORAL_TABLET | ORAL | 0 refills | Status: DC

## 2019-08-31 NOTE — Telephone Encounter (Signed)
Prescription sent electronically to pharmacy. 

## 2019-08-31 NOTE — Telephone Encounter (Signed)
Pharmacy requesting refill on Metformin 500 mg tablets. Take one tablet by mouth once daily. Pt last seen 11/10/2018 for HTN. Please advise. Thank you

## 2019-08-31 NOTE — Addendum Note (Signed)
Addended by: Dairl Ponder on: 08/31/2019 04:42 PM   Modules accepted: Orders

## 2019-08-31 NOTE — Telephone Encounter (Signed)
Ok six mo worth 

## 2019-10-17 ENCOUNTER — Other Ambulatory Visit: Payer: Self-pay | Admitting: Nurse Practitioner

## 2019-10-24 ENCOUNTER — Encounter: Payer: Self-pay | Admitting: Family Medicine

## 2019-12-04 ENCOUNTER — Other Ambulatory Visit: Payer: Self-pay | Admitting: Family Medicine

## 2019-12-05 NOTE — Telephone Encounter (Signed)
Please contact patient to set up appt; then may route back to nurses. Thank you  

## 2019-12-05 NOTE — Telephone Encounter (Signed)
Pt has cpe scheduled 5/14 with Eber Jones. Does pt need lab work done

## 2019-12-12 IMAGING — MG DIGITAL SCREENING BILATERAL MAMMOGRAM WITH TOMO AND CAD
6 of 10 series · 6 of 30 positions shown · non-contrast
Comparison: Previous exam(s).

CLINICAL DATA: Screening.

EXAM:
DIGITAL SCREENING BILATERAL MAMMOGRAM WITH TOMO AND CAD

[R CC synth-2D]
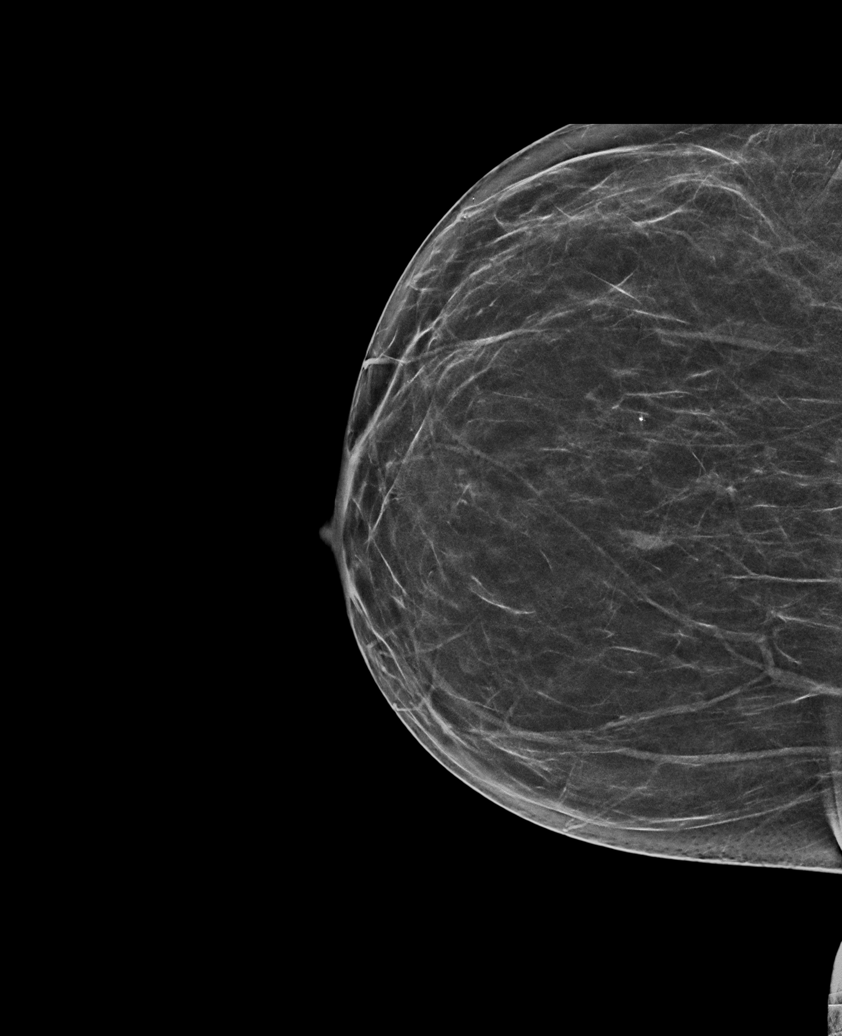

[R MLO synth-2D (1 of 2)]
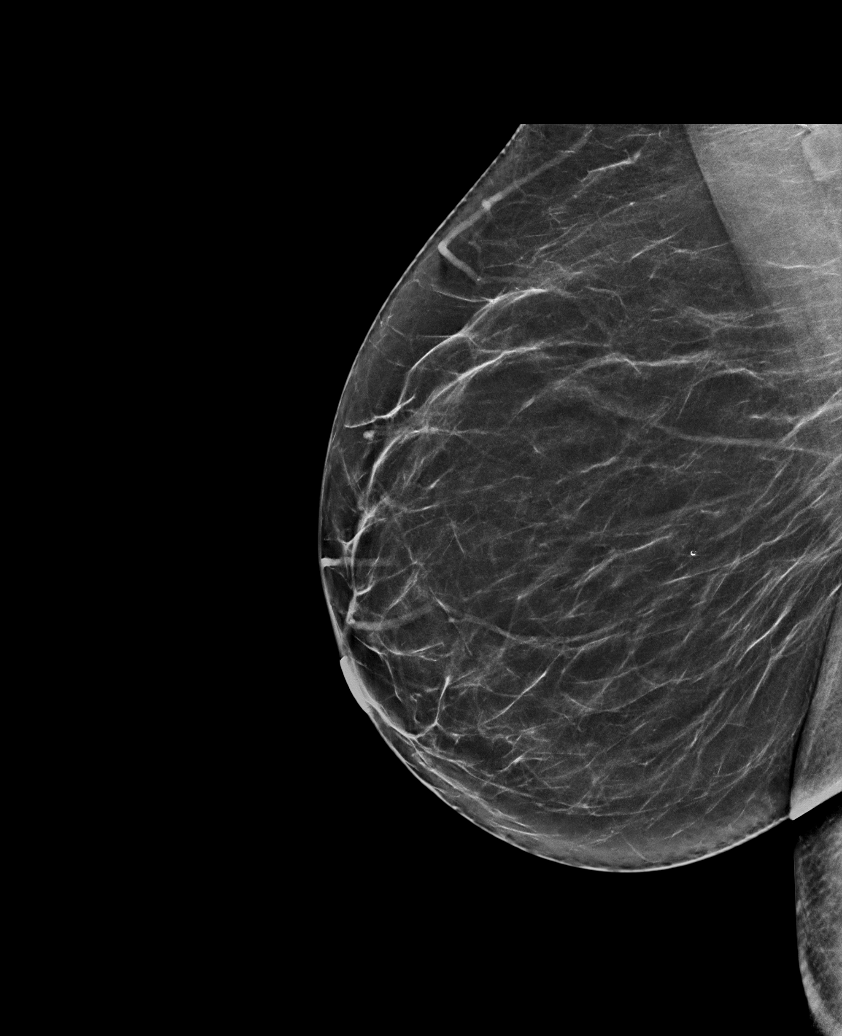

[L MLO synth-2D]
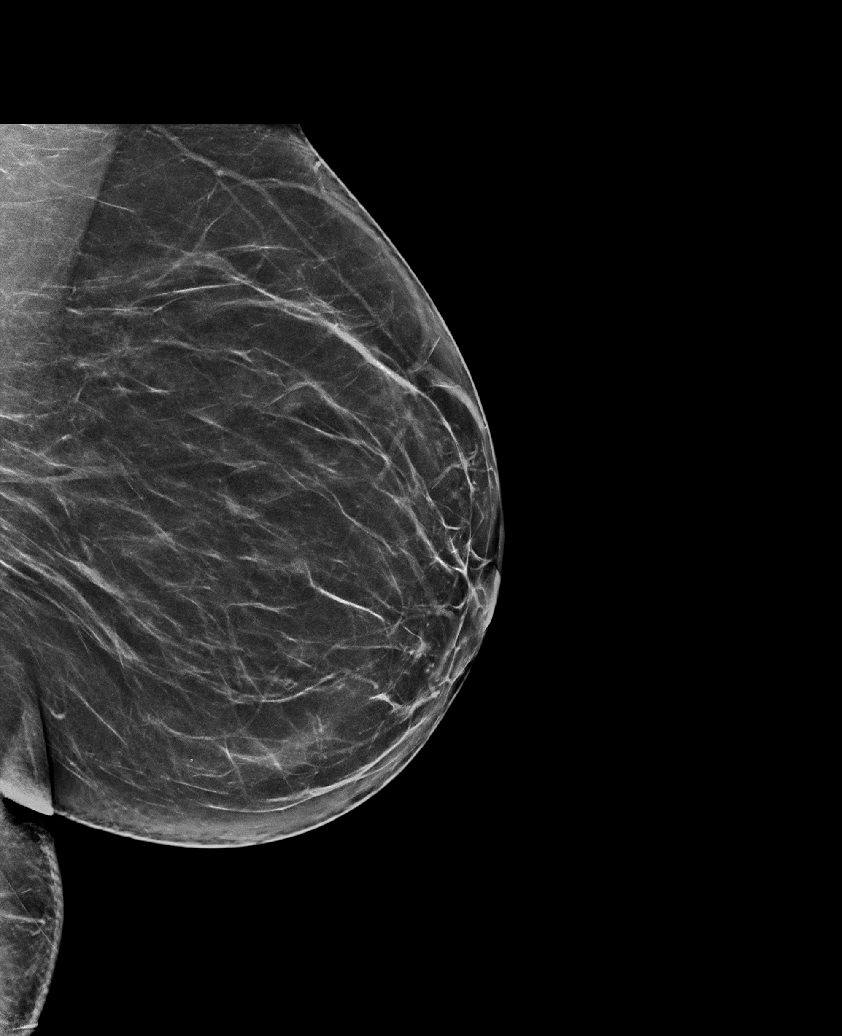

[R MLO synth-2D (2 of 2)]
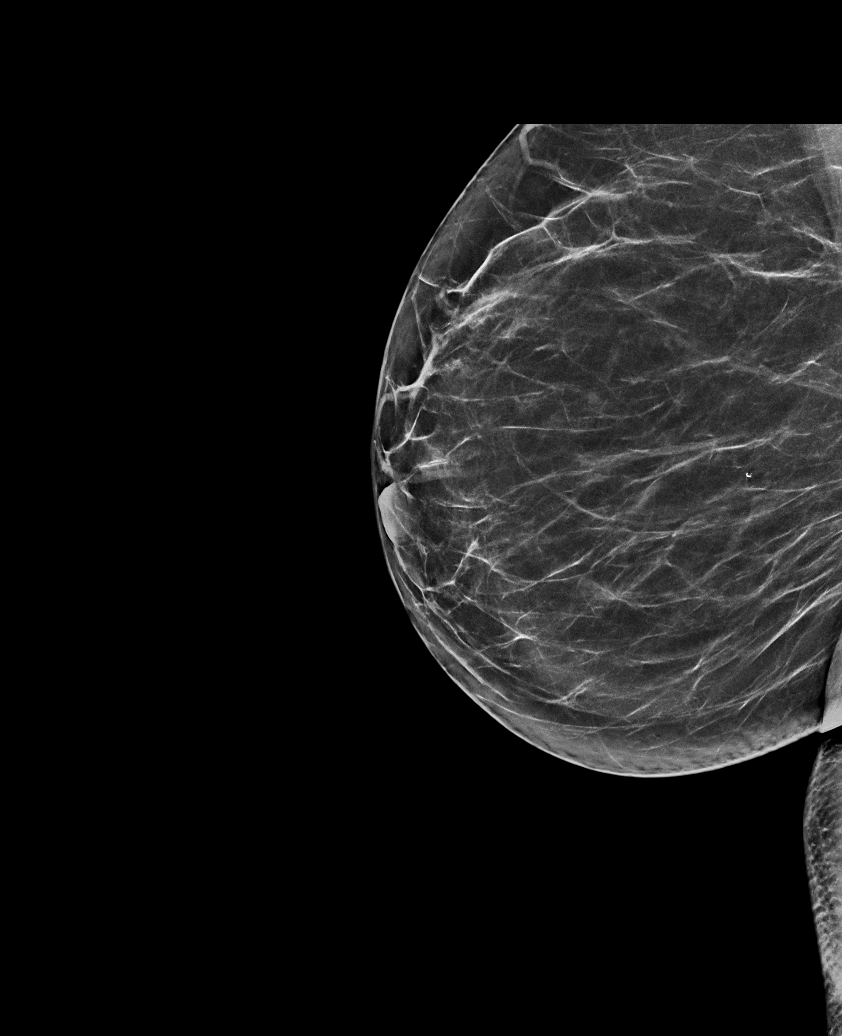

[L CC synth-2D]
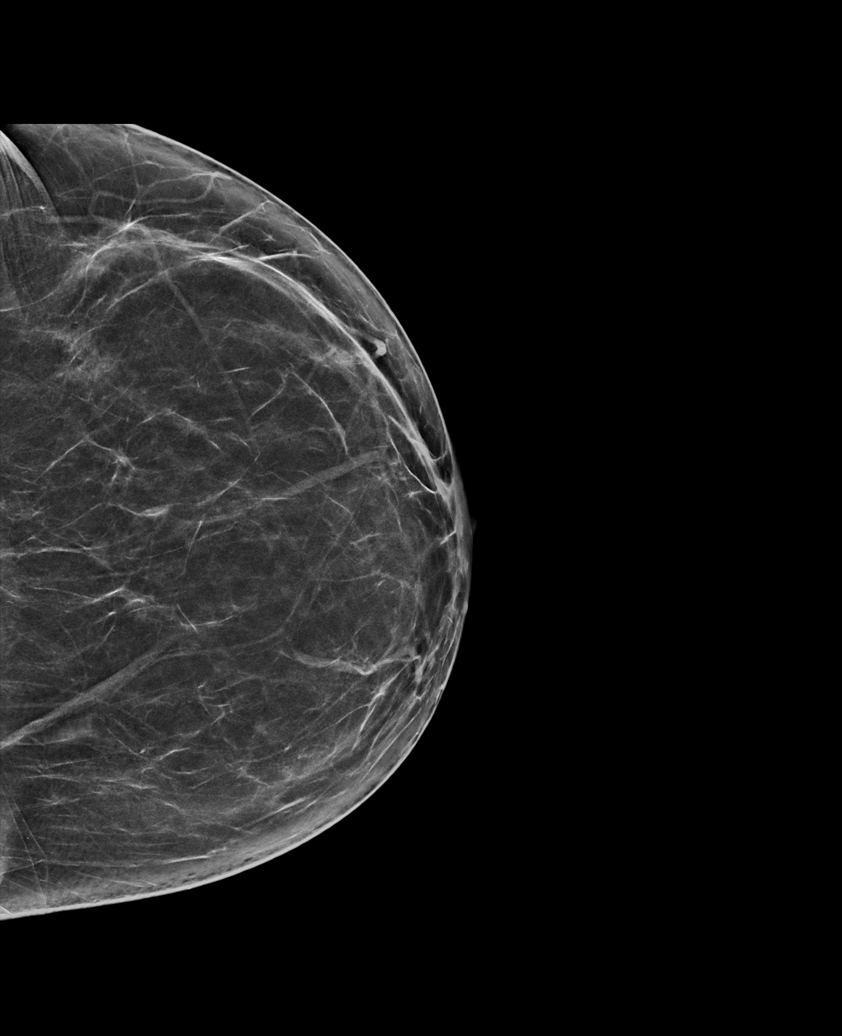

[L MLO tomo · tomo slice 36/71.0]
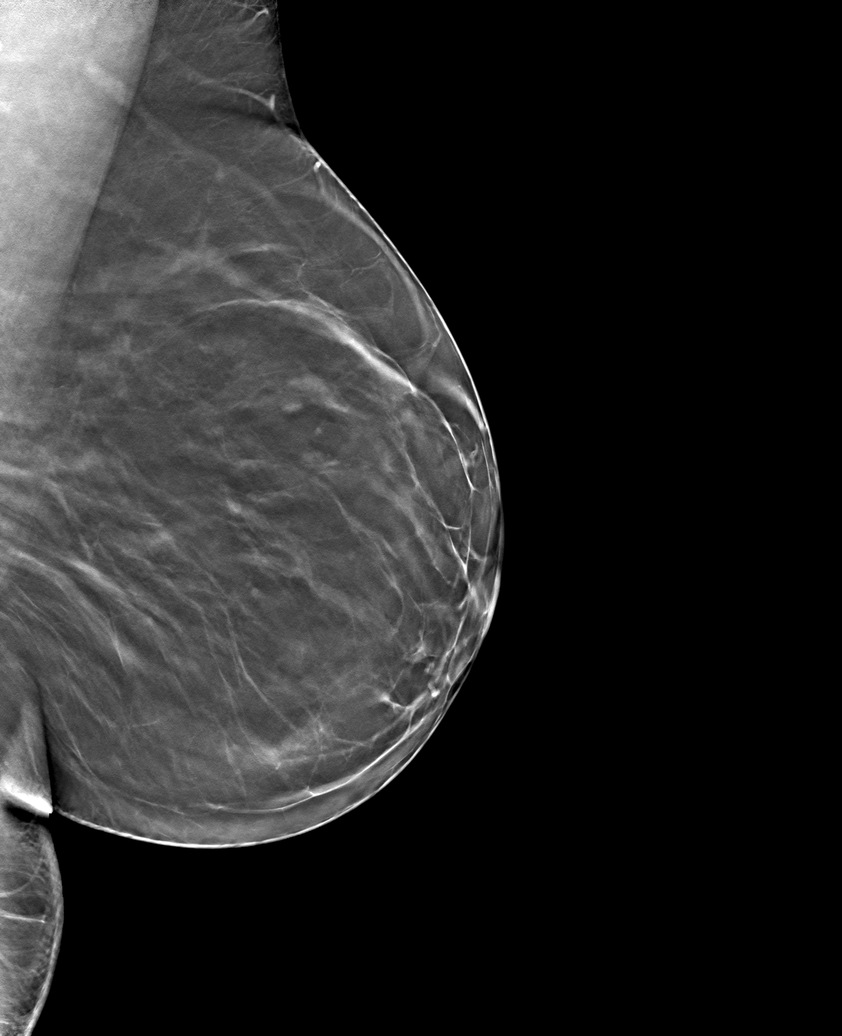

[6 of 30 positions shown; findings below may reference images not displayed]

ACR Breast Density Category b: There are scattered areas of
fibroglandular density.
FINDINGS: There are no findings suspicious for malignancy. Images were
processed with CAD.
IMPRESSION: No mammographic evidence of malignancy. A result letter of this
screening mammogram will be mailed directly to the patient.

RECOMMENDATION:
Screening mammogram in one year. (Code:[id number])

BI-RADS CATEGORY  1: Negative.

## 2020-02-03 ENCOUNTER — Encounter: Payer: 59 | Admitting: Nurse Practitioner

## 2020-02-17 ENCOUNTER — Ambulatory Visit (INDEPENDENT_AMBULATORY_CARE_PROVIDER_SITE_OTHER): Payer: 59 | Admitting: Nurse Practitioner

## 2020-02-17 ENCOUNTER — Other Ambulatory Visit: Payer: Self-pay

## 2020-02-17 ENCOUNTER — Encounter: Payer: Self-pay | Admitting: Nurse Practitioner

## 2020-02-17 VITALS — BP 138/94 | Temp 97.7°F | Ht 62.0 in | Wt 173.0 lb

## 2020-02-17 DIAGNOSIS — Z13220 Encounter for screening for lipoid disorders: Secondary | ICD-10-CM

## 2020-02-17 DIAGNOSIS — Z79899 Other long term (current) drug therapy: Secondary | ICD-10-CM

## 2020-02-17 DIAGNOSIS — D509 Iron deficiency anemia, unspecified: Secondary | ICD-10-CM | POA: Diagnosis not present

## 2020-02-17 DIAGNOSIS — Z01419 Encounter for gynecological examination (general) (routine) without abnormal findings: Secondary | ICD-10-CM | POA: Diagnosis not present

## 2020-02-17 DIAGNOSIS — E559 Vitamin D deficiency, unspecified: Secondary | ICD-10-CM | POA: Diagnosis not present

## 2020-02-17 DIAGNOSIS — E119 Type 2 diabetes mellitus without complications: Secondary | ICD-10-CM

## 2020-02-17 LAB — POCT GLYCOSYLATED HEMOGLOBIN (HGB A1C): Hemoglobin A1C: 5.4 % (ref 4.0–5.6)

## 2020-02-17 NOTE — Progress Notes (Signed)
   Subjective:    Patient ID: [name], female    DOB: [birth year], 44 y.o.   MRN: [id number]  HPI  The patient comes in today for a wellness visit.    A review of their health history was completed.  A review of medications was also completed.  Any needed refills; not right now  Eating habits: not great since covid  Falls/  MVA accidents in past few months: none  Regular exercise: walking and biking  Specialist pt sees on regular basis: none  Preventative health issues were discussed.   Additional concerns: ringing in right ear for a long time but has gotten worse in the last month   Review of Systems     Objective:   Physical Exam        Assessment & Plan:

## 2020-02-17 NOTE — Progress Notes (Signed)
Subjective:    Patient ID: [name], female    DOB: September 06, 1976, 44 y.o.   MRN: [id number]  HPI Presents today for annual wellness exam.Vision and dental exams last completed in 2020.Marland KitchenSmokes 15 cigarettes daily, denies alcohol use,States sexually active with same partner and states partner had a vasectomy. Regular cycles with normal flow. Family history of breast cancer, her paternal grandmother and a paternal aunt. Schedules mammograms herself yearly. Her main issue is drinking about 4 cans or regular soda per day. Regular walking and bike riding for activity.    Review of Systems  Constitutional: Negative for fever and unexpected weight change.  Respiratory: Negative for cough, chest tightness, shortness of breath and wheezing.   Cardiovascular: Negative for chest pain.  Gastrointestinal: Negative for abdominal pain, blood in stool, constipation, diarrhea, nausea and vomiting.  Genitourinary: Negative for difficulty urinating, dysuria, enuresis, frequency, genital sores, hematuria, menstrual problem, pelvic pain, urgency, vaginal bleeding and vaginal discharge.   Depression screen [facility] 2/9 02/17/2020 12/07/2017  Decreased Interest 0 1  Down, Depressed, Hopeless 0 1  PHQ - 2 Score 0 2  Altered sleeping - 3  Tired, decreased energy - 3  Change in appetite - 1  Feeling bad or failure about yourself  - 1  Trouble concentrating - 1  Moving slowly or fidgety/restless - 0  Suicidal thoughts - 0  PHQ-9 Score - 11        Objective:   Physical Exam Vitals reviewed.  Constitutional:      General: She is not in acute distress.    Appearance: Normal appearance.  Neck:     Thyroid: No thyroid mass, thyromegaly or thyroid tenderness.  Cardiovascular:     Rate and Rhythm: Normal rate and regular rhythm.     Heart sounds: Normal heart sounds.  Pulmonary:     Effort: Pulmonary effort is normal.     Breath sounds: Normal breath sounds.  Chest:     Breasts:        Right: Normal. No  swelling, bleeding, inverted nipple, mass, skin change or tenderness.        Left: Normal. No swelling, bleeding, inverted nipple, mass, skin change or tenderness.  Abdominal:     General: There is no distension.     Palpations: Abdomen is soft.     Tenderness: There is no abdominal tenderness.  Genitourinary:    Comments: External GU: no rash or lesions. Vagina: no discharge. Cervix normal in appearance. No CMT. Bimanual exam: no tenderness or obvious masses.  Lymphadenopathy:     Cervical: No cervical adenopathy.     Right cervical: No superficial cervical adenopathy.    Left cervical: No superficial cervical adenopathy.     Upper Body:     Right upper body: No supraclavicular, axillary or pectoral adenopathy.     Left upper body: No supraclavicular, axillary or pectoral adenopathy.  Skin:    General: Skin is warm and dry.     Findings: No petechiae or rash.  Neurological:     Mental Status: She is alert.  Psychiatric:        Attention and Perception: Attention and perception normal.        Mood and Affect: Mood and affect normal.        Speech: Speech normal.        Behavior: Behavior normal.    Diabetic Foot Exam - Simple   Simple Foot Form Diabetic Foot exam was performed with the following findings: Yes 02/17/2020  1:20 PM  Visual Inspection No deformities, no ulcerations, no other skin breakdown bilaterally: Yes Sensation Testing Intact to touch and monofilament testing bilaterally: Yes Pulse Check Posterior Tibialis and Dorsalis pulse intact bilaterally: Yes Comments    A1C today 5.4.        Assessment & Plan:   Problem List Items Addressed This Visit      Endocrine   Type 2 diabetes mellitus without complication, without long-term current use of insulin (HCC)   Relevant Orders   POCT glycosylated hemoglobin (Hb A1C) (Completed)   Microalbumin / creatinine urine ratio     Other   Iron deficiency anemia   Relevant Orders   Basic metabolic panel   CBC  with Differential/Platelet   TSH   Vitamin D deficiency   Relevant Orders   VITAMIN D 25 Hydroxy (Vit-D Deficiency, Fractures)    Other Visit Diagnoses    Well woman exam    -  Primary   Relevant Orders   Basic metabolic panel   TSH   High risk medication use       Relevant Orders   Hepatic function panel   Screening for lipid disorders       Relevant Orders   Lipid panel     Nasacort nasal spray recommended for seasonal allergies and congestion. Plans to schedule her own mammogram yearly related to family history of breast cancer. Patient feels she cannot quit smoking at this time. To contact office if she needs assistance. Goal is for smoking cessation to decrease from 15 to 5 cigarettes daily and decrease soda intake from 4 cans to 2 cans daily.  Continue regular activity. Labs pending.  Follow up based on lab results.

## 2020-02-18 ENCOUNTER — Encounter: Payer: Self-pay | Admitting: Nurse Practitioner

## 2020-02-18 LAB — LIPID PANEL
Chol/HDL Ratio: 4.3 ratio (ref 0.0–4.4)
Cholesterol, Total: 195 mg/dL (ref 100–199)
HDL: 45 mg/dL (ref >39)
LDL Chol Calc (NIH): 122 mg/dL — ABNORMAL HIGH (ref 0–99)
Triglycerides: 156 mg/dL — ABNORMAL HIGH (ref 0–149)
VLDL Cholesterol Cal: 28 mg/dL (ref 5–40)

## 2020-02-18 LAB — MICROALBUMIN / CREATININE URINE RATIO
Creatinine, Urine: 384.8 mg/dL
Microalb/Creat Ratio: 9 mg/g creat (ref 0–29)
Microalbumin, Urine: 34.7 ug/mL

## 2020-02-18 LAB — BASIC METABOLIC PANEL
BUN/Creatinine Ratio: 16 (ref 9–23)
BUN: 13 mg/dL (ref 6–24)
CO2: 23 mmol/L (ref 20–29)
Calcium: 9.6 mg/dL (ref 8.7–10.2)
Chloride: 100 mmol/L (ref 96–106)
Creatinine, Ser: 0.79 mg/dL (ref 0.57–1.00)
GFR calc Af Amer: 107 mL/min/{1.73_m2} (ref >59)
GFR calc non Af Amer: 93 mL/min/{1.73_m2} (ref >59)
Glucose: 121 mg/dL — ABNORMAL HIGH (ref 65–99)
Potassium: 4.4 mmol/L (ref 3.5–5.2)
Sodium: 140 mmol/L (ref 134–144)

## 2020-02-18 LAB — CBC WITH DIFFERENTIAL/PLATELET
Basophils Absolute: 0.1 10*3/uL (ref 0.0–0.2)
Basos: 0 %
EOS (ABSOLUTE): 0.2 10*3/uL (ref 0.0–0.4)
Eos: 2 %
Hematocrit: 37.7 % (ref 34.0–46.6)
Hemoglobin: 11.6 g/dL (ref 11.1–15.9)
Immature Grans (Abs): 0 10*3/uL (ref 0.0–0.1)
Immature Granulocytes: 0 %
Lymphocytes Absolute: 3.6 10*3/uL — ABNORMAL HIGH (ref 0.7–3.1)
Lymphs: 30 %
MCH: 22.4 pg — ABNORMAL LOW (ref 26.6–33.0)
MCHC: 30.8 g/dL — ABNORMAL LOW (ref 31.5–35.7)
MCV: 73 fL — ABNORMAL LOW (ref 79–97)
Monocytes Absolute: 1.2 10*3/uL — ABNORMAL HIGH (ref 0.1–0.9)
Monocytes: 10 %
Neutrophils Absolute: 6.8 10*3/uL (ref 1.4–7.0)
Neutrophils: 58 %
Platelets: 256 10*3/uL (ref 150–450)
RBC: 5.19 x10E6/uL (ref 3.77–5.28)
RDW: 16.5 % — ABNORMAL HIGH (ref 11.7–15.4)
WBC: 11.9 10*3/uL — ABNORMAL HIGH (ref 3.4–10.8)

## 2020-02-18 LAB — HEPATIC FUNCTION PANEL
ALT: 28 IU/L (ref 0–32)
AST: 21 IU/L (ref 0–40)
Albumin: 4.6 g/dL (ref 3.8–4.8)
Alkaline Phosphatase: 71 IU/L (ref 48–121)
Bilirubin Total: 0.3 mg/dL (ref 0.0–1.2)
Bilirubin, Direct: 0.1 mg/dL (ref 0.00–0.40)
Total Protein: 7.5 g/dL (ref 6.0–8.5)

## 2020-02-18 LAB — TSH: TSH: 1.39 u[IU]/mL (ref 0.450–4.500)

## 2020-02-18 LAB — VITAMIN D 25 HYDROXY (VIT D DEFICIENCY, FRACTURES): Vit D, 25-Hydroxy: 31 ng/mL (ref 30.0–100.0)

## 2020-02-22 ENCOUNTER — Other Ambulatory Visit: Payer: Self-pay | Admitting: *Deleted

## 2020-02-22 ENCOUNTER — Other Ambulatory Visit: Payer: Self-pay | Admitting: Nurse Practitioner

## 2020-02-22 DIAGNOSIS — Z79899 Other long term (current) drug therapy: Secondary | ICD-10-CM

## 2020-02-22 DIAGNOSIS — Z13220 Encounter for screening for lipoid disorders: Secondary | ICD-10-CM

## 2020-02-22 MED ORDER — METFORMIN HCL 500 MG PO TABS: ORAL_TABLET | ORAL | 1 refills | Status: DC

## 2020-02-22 MED ORDER — ROSUVASTATIN CALCIUM 5 MG PO TABS: 5.0000 mg | ORAL_TABLET | Freq: Every day | ORAL | 2 refills | Status: DC

## 2020-02-22 NOTE — Progress Notes (Signed)
Lab work ordered and patient notified.

## 2020-05-23 ENCOUNTER — Other Ambulatory Visit: Payer: Self-pay | Admitting: Nurse Practitioner

## 2020-05-25 LAB — LIPID PANEL
Chol/HDL Ratio: 3.9 ratio (ref 0.0–4.4)
Cholesterol, Total: 155 mg/dL (ref 100–199)
HDL: 40 mg/dL (ref >39)
LDL Chol Calc (NIH): 87 mg/dL (ref 0–99)
Triglycerides: 159 mg/dL — ABNORMAL HIGH (ref 0–149)
VLDL Cholesterol Cal: 28 mg/dL (ref 5–40)

## 2020-05-25 LAB — HEPATIC FUNCTION PANEL
ALT: 24 IU/L (ref 0–32)
AST: 23 IU/L (ref 0–40)
Albumin: 4.5 g/dL (ref 3.8–4.8)
Alkaline Phosphatase: 73 IU/L (ref 48–121)
Bilirubin Total: 0.3 mg/dL (ref 0.0–1.2)
Bilirubin, Direct: 0.1 mg/dL (ref 0.00–0.40)
Total Protein: 7.2 g/dL (ref 6.0–8.5)

## 2020-05-25 MED ORDER — ROSUVASTATIN CALCIUM 5 MG PO TABS: 5.0000 mg | ORAL_TABLET | Freq: Every day | ORAL | 2 refills | Status: DC

## 2020-05-25 NOTE — Addendum Note (Signed)
Addended by: Margaretha Sheffield on: 05/25/2020 10:33 AM   Modules accepted: Orders

## 2020-08-28 ENCOUNTER — Other Ambulatory Visit: Payer: Self-pay | Admitting: Nurse Practitioner

## 2020-08-29 ENCOUNTER — Telehealth: Payer: Self-pay

## 2020-08-29 NOTE — Telephone Encounter (Signed)
Patient has appointment on 12/16 for medication followup but only has three pills left. [facility]

## 2020-08-29 NOTE — Telephone Encounter (Signed)
Please contact patient and have her set up an appt.

## 2020-08-31 NOTE — Telephone Encounter (Signed)
Error please close made appointment 

## 2020-09-06 ENCOUNTER — Other Ambulatory Visit: Payer: Self-pay

## 2020-09-06 ENCOUNTER — Ambulatory Visit: Payer: 59 | Admitting: Family Medicine

## 2020-09-06 ENCOUNTER — Encounter: Payer: Self-pay | Admitting: Family Medicine

## 2020-09-06 VITALS — BP 132/88 | Temp 97.2°F | Wt 171.2 lb

## 2020-09-06 DIAGNOSIS — E1169 Type 2 diabetes mellitus with other specified complication: Secondary | ICD-10-CM | POA: Diagnosis not present

## 2020-09-06 DIAGNOSIS — T7840XA Allergy, unspecified, initial encounter: Secondary | ICD-10-CM

## 2020-09-06 DIAGNOSIS — R030 Elevated blood-pressure reading, without diagnosis of hypertension: Secondary | ICD-10-CM | POA: Diagnosis not present

## 2020-09-06 DIAGNOSIS — E119 Type 2 diabetes mellitus without complications: Secondary | ICD-10-CM | POA: Diagnosis not present

## 2020-09-06 DIAGNOSIS — E785 Hyperlipidemia, unspecified: Secondary | ICD-10-CM

## 2020-09-06 MED ORDER — ROSUVASTATIN CALCIUM 5 MG PO TABS: ORAL_TABLET | ORAL | 1 refills | Status: DC

## 2020-09-06 MED ORDER — CETIRIZINE HCL 10 MG PO TABS: 10.0000 mg | ORAL_TABLET | Freq: Every day | ORAL | 11 refills | Status: DC

## 2020-09-06 MED ORDER — METFORMIN HCL 500 MG PO TABS: ORAL_TABLET | ORAL | 1 refills | Status: DC

## 2020-09-06 NOTE — Progress Notes (Signed)
Patient ID: [name], female    DOB: 04-24-1976, 44 y.o.   MRN: [id number]   Chief Complaint  Patient presents with  . Diabetes  . Hyperlipidemia   Subjective:  CC: medication management for type 2 diabetes and hyperlipidemia.  Presents today for medication management for type 2 diabetes and hyperlipidemia.  Her last labs were done in September 2021 liver enzymes good her lipid profile is fairly good.  Her renal function labs were done in May 2021 all looks good.  She is having issues with high blood pressure occasionally, will recheck today.  She denies fever, chills, chest pain, shortness of breath, any swelling in her lower extremities.  She is a smoker, knows that she should quit.    Medical History Railyn has a past medical history of Anxiety, Asthma, Depression, Hypertension, Insomnia, and Reflux.   Outpatient Encounter Medications as of 09/06/2020  Medication Sig  . cetirizine (ZYRTEC) 10 MG tablet Take 1 tablet (10 mg total) by mouth daily.  . IRON PO Take 65 mg by mouth.  . metFORMIN (GLUCOPHAGE) 500 MG tablet TAKE 1 TABLET BY MOUTH ONCE DAILY  . ONETOUCH VERIO test strip USE 1 STRIP TO CHECK GLUCOSE ONCE DAILY AS DIRECTED  . rosuvastatin (CRESTOR) 5 MG tablet TAKE 1 TABLET BY MOUTH ONCE DAILY FOR CHOLESTEROL  . [DISCONTINUED] metFORMIN (GLUCOPHAGE) 500 MG tablet TAKE 1 TABLET BY MOUTH ONCE DAILY  . [DISCONTINUED] rosuvastatin (CRESTOR) 5 MG tablet TAKE 1 TABLET BY MOUTH ONCE DAILY FOR CHOLESTEROL   No facility-administered encounter medications on file as of 09/06/2020.     Review of Systems  Constitutional: Negative for chills and fever.  Respiratory: Negative for shortness of breath.   Cardiovascular: Negative for chest pain and leg swelling.  Gastrointestinal: Negative for abdominal pain.     Vitals BP 132/88   Temp (!) 97.2 F (36.2 C)   Wt 171 lb 3.2 oz (77.7 kg)   BMI 31.31 kg/m   Objective:   Physical Exam Vitals reviewed.  Constitutional:       Appearance: Normal appearance.  Cardiovascular:     Rate and Rhythm: Normal rate and regular rhythm.     Heart sounds: Normal heart sounds.  Pulmonary:     Effort: Pulmonary effort is normal.     Breath sounds: Normal breath sounds.  Musculoskeletal:     Right lower leg: No edema.     Left lower leg: No edema.  Skin:    General: Skin is warm and dry.  Neurological:     General: No focal deficit present.     Mental Status: She is alert.  Psychiatric:        Behavior: Behavior normal.      Assessment and Plan   1. Allergy, initial encounter - cetirizine (ZYRTEC) 10 MG tablet; Take 1 tablet (10 mg total) by mouth daily.  Dispense: 30 tablet; Refill: 11  2. Type 2 diabetes mellitus without complication, without long-term current use of insulin (HCC) - metFORMIN (GLUCOPHAGE) 500 MG tablet; TAKE 1 TABLET BY MOUTH ONCE DAILY  Dispense: 90 tablet; Refill: 1 - CBC with Differential/Platelet - Hemoglobin A1c - CMP14+EGFR  3. Hyperlipidemia associated with type 2 diabetes mellitus (HCC) - rosuvastatin (CRESTOR) 5 MG tablet; TAKE 1 TABLET BY MOUTH ONCE DAILY FOR CHOLESTEROL  Dispense: 90 tablet; Refill: 1  4. Elevated blood pressure reading - CBC with Differential/Platelet - CMP14+EGFR   During the interview today is revealed that she takes ibuprofen and allergy medication that  contains Sudafed, encouraged her to stop taking this medication due to its effect on blood pressure.  She will try Zyrtec to see how that affects her blood pressure.  She has access to a blood pressure monitor, she is encouraged to take her blood pressure at least weekly and notify us if the results are greater than 130/80. Recheck of her blood pressure today was 132/88 this is much better than the initial pressure when she came in, she is encouraged to keep an eye on this and let us know.  Routine medication refills done today, she will follow-up in April for her wellness visit with labs done 1 week prior  to that visit, then she will be a every 6 month medication management.  She reports that her glucose meter is not as accurate as it should be, we will send for a new meter along with her lancets and strips to the pharmacy.  Agrees with plan of care discussed today. Understands warning signs to seek further care: Chest pain, shortness of breath, any significant change in health, blood pressures consistently greater than then discussed. Understands to follow-up in April for wellness with Pearson Forster, NP then every 6 months for medication management.  She will get labs 1 week prior to her April visit with Hoyle Sauer.  Pecolia Ades, FNP-C

## 2020-09-06 NOTE — Patient Instructions (Signed)
Get labs one week before your wellness visit in April. Stop sudafed and ibuprofen Monitor blood pressure few times per week and let us know if consistently > 130/80    Coping with Quitting Smoking  Quitting smoking is a physical and mental challenge. You will face cravings, withdrawal symptoms, and temptation. Before quitting, work with your health care provider to make a plan that can help you cope. Preparation can help you quit and keep you from giving in. How can I cope with cravings? Cravings usually last for 5-10 minutes. If you get through it, the craving will pass. Consider taking the following actions to help you cope with cravings:  Keep your mouth busy: ? Chew sugar-free gum. ? Suck on hard candies or a straw. ? Brush your teeth.  Keep your hands and body busy: ? Immediately change to a different activity when you feel a craving. ? Squeeze or play with a ball. ? Do an activity or a hobby, like making bead jewelry, practicing needlepoint, or working with wood. ? Mix up your normal routine. ? Take a short exercise break. Go for a quick walk or run up and down stairs. ? Spend time in public places where smoking is not allowed.  Focus on doing something kind or helpful for someone else.  Call a friend or family member to talk during a craving.  Join a support group.  Call a quit line, such as 1-800-QUIT-NOW.  Talk with your health care provider about medicines that might help you cope with cravings and make quitting easier for you. How can I deal with withdrawal symptoms? Your body may experience negative effects as it tries to get used to not having nicotine in the system. These effects are called withdrawal symptoms. They may include:  Feeling hungrier than normal.  Trouble concentrating.  Irritability.  Trouble sleeping.  Feeling depressed.  Restlessness and agitation.  Craving a cigarette. To manage withdrawal symptoms:  Avoid places, people, and  activities that trigger your cravings.  Remember why you want to quit.  Get plenty of sleep.  Avoid coffee and other caffeinated drinks. These may worsen some of your symptoms. How can I handle social situations? Social situations can be difficult when you are quitting smoking, especially in the first few weeks. To manage this, you can:  Avoid parties, bars, and other social situations where people might be smoking.  Avoid alcohol.  Leave right away if you have the urge to smoke.  Explain to your family and friends that you are quitting smoking. Ask for understanding and support.  Plan activities with friends or family where smoking is not an option. What are some ways I can cope with stress? Wanting to smoke may cause stress, and stress can make you want to smoke. Find ways to manage your stress. Relaxation techniques can help. For example:  Breathe slowly and deeply, in through your nose and out through your mouth.  Listen to soothing, relaxing music.  Talk with a family member or friend about your stress.  Light a candle.  Soak in a bath or take a shower.  Think about a peaceful place. What are some ways I can prevent weight gain? Be aware that many people gain weight after they quit smoking. However, not everyone does. To keep from gaining weight, have a plan in place before you quit and stick to the plan after you quit. Your plan should include:  Having healthy snacks. When you have a craving, it may help to: ?  Eat plain popcorn, crunchy carrots, celery, or other cut vegetables. ? Chew sugar-free gum.  Changing how you eat: ? Eat small portion sizes at meals. ? Eat 4-6 small meals throughout the day instead of 1-2 large meals a day. ? Be mindful when you eat. Do not watch television or do other things that might distract you as you eat.  Exercising regularly: ? Make time to exercise each day. If you do not have time for a long workout, do short bouts of exercise for  5-10 minutes several times a day. ? Do some form of strengthening exercise, like weight lifting, and some form of aerobic exercise, like running or swimming.  Drinking plenty of water or other low-calorie or no-calorie drinks. Drink 6-8 glasses of water daily, or as much as instructed by your health care provider. Summary  Quitting smoking is a physical and mental challenge. You will face cravings, withdrawal symptoms, and temptation to smoke again. Preparation can help you as you go through these challenges.  You can cope with cravings by keeping your mouth busy (such as by chewing gum), keeping your body and hands busy, and making calls to family, friends, or a helpline for people who want to quit smoking.  You can cope with withdrawal symptoms by avoiding places where people smoke, avoiding drinks with caffeine, and getting plenty of rest.  Ask your health care provider about the different ways to prevent weight gain, avoid stress, and handle social situations. This information is not intended to replace advice given to you by your health care provider. Make sure you discuss any questions you have with your health care provider. Document Revised: 08/21/2017 Document Reviewed: 09/05/2016 Elsevier Patient Education  2020 [employer].

## 2020-11-27 LAB — HM DIABETES EYE EXAM

## 2021-01-04 ENCOUNTER — Ambulatory Visit: Payer: 59 | Admitting: Nurse Practitioner

## 2021-03-21 ENCOUNTER — Other Ambulatory Visit: Payer: Self-pay | Admitting: Family Medicine

## 2021-03-21 DIAGNOSIS — E119 Type 2 diabetes mellitus without complications: Secondary | ICD-10-CM

## 2021-03-24 ENCOUNTER — Other Ambulatory Visit: Payer: Self-pay | Admitting: Family Medicine

## 2021-03-24 DIAGNOSIS — E119 Type 2 diabetes mellitus without complications: Secondary | ICD-10-CM

## 2021-04-11 ENCOUNTER — Telehealth: Payer: Self-pay | Admitting: Family Medicine

## 2021-04-11 DIAGNOSIS — R030 Elevated blood-pressure reading, without diagnosis of hypertension: Secondary | ICD-10-CM

## 2021-04-11 DIAGNOSIS — Z79899 Other long term (current) drug therapy: Secondary | ICD-10-CM

## 2021-04-11 DIAGNOSIS — E119 Type 2 diabetes mellitus without complications: Secondary | ICD-10-CM

## 2021-04-11 NOTE — Telephone Encounter (Signed)
Pt had labs ordered 09/06/20 but did not have them completed. Labs ordered were CMP14+EGFR, A1C and CBC with Diff. Please advise. Thank you

## 2021-04-11 NOTE — Telephone Encounter (Signed)
Patient has appointment for 8/1 for medication follow up and she wanting to know if she need labs done

## 2021-04-15 ENCOUNTER — Other Ambulatory Visit: Payer: Self-pay | Admitting: Family Medicine

## 2021-04-15 DIAGNOSIS — E785 Hyperlipidemia, unspecified: Secondary | ICD-10-CM

## 2021-04-15 DIAGNOSIS — E1169 Type 2 diabetes mellitus with other specified complication: Secondary | ICD-10-CM

## 2021-04-15 NOTE — Telephone Encounter (Signed)
Blood work ordered in Epic. Patient notified. 

## 2021-04-15 NOTE — Telephone Encounter (Signed)
[name], Malena M, DO      Yes please re-order. Thx

## 2021-04-20 LAB — CBC WITH DIFFERENTIAL/PLATELET
Basophils Absolute: 0 10*3/uL (ref 0.0–0.2)
Basos: 1 %
EOS (ABSOLUTE): 0.1 10*3/uL (ref 0.0–0.4)
Eos: 1 %
Hematocrit: 40.2 % (ref 34.0–46.6)
Hemoglobin: 12.4 g/dL (ref 11.1–15.9)
Immature Grans (Abs): 0 10*3/uL (ref 0.0–0.1)
Immature Granulocytes: 0 %
Lymphocytes Absolute: 2.9 10*3/uL (ref 0.7–3.1)
Lymphs: 33 %
MCH: 23.3 pg — ABNORMAL LOW (ref 26.6–33.0)
MCHC: 30.8 g/dL — ABNORMAL LOW (ref 31.5–35.7)
MCV: 75 fL — ABNORMAL LOW (ref 79–97)
Monocytes Absolute: 0.6 10*3/uL (ref 0.1–0.9)
Monocytes: 7 %
Neutrophils Absolute: 5.1 10*3/uL (ref 1.4–7.0)
Neutrophils: 58 %
Platelets: 213 10*3/uL (ref 150–450)
RBC: 5.33 x10E6/uL — ABNORMAL HIGH (ref 3.77–5.28)
RDW: 16.5 % — ABNORMAL HIGH (ref 11.7–15.4)
WBC: 8.8 10*3/uL (ref 3.4–10.8)

## 2021-04-20 LAB — COMPREHENSIVE METABOLIC PANEL
ALT: 23 IU/L (ref 0–32)
AST: 25 IU/L (ref 0–40)
Albumin/Globulin Ratio: 1.4 (ref 1.2–2.2)
Albumin: 4.3 g/dL (ref 3.8–4.8)
Alkaline Phosphatase: 67 IU/L (ref 44–121)
BUN/Creatinine Ratio: 19 (ref 9–23)
BUN: 14 mg/dL (ref 6–24)
Bilirubin Total: 0.4 mg/dL (ref 0.0–1.2)
CO2: 21 mmol/L (ref 20–29)
Calcium: 9.2 mg/dL (ref 8.7–10.2)
Chloride: 105 mmol/L (ref 96–106)
Creatinine, Ser: 0.72 mg/dL (ref 0.57–1.00)
Globulin, Total: 3 g/dL (ref 1.5–4.5)
Glucose: 132 mg/dL — ABNORMAL HIGH (ref 65–99)
Potassium: 4.1 mmol/L (ref 3.5–5.2)
Sodium: 139 mmol/L (ref 134–144)
Total Protein: 7.3 g/dL (ref 6.0–8.5)
eGFR: 106 mL/min/{1.73_m2} (ref >59)

## 2021-04-20 LAB — HEMOGLOBIN A1C
Est. average glucose Bld gHb Est-mCnc: 146 mg/dL
Hgb A1c MFr Bld: 6.7 % — ABNORMAL HIGH (ref 4.8–5.6)

## 2021-04-22 ENCOUNTER — Other Ambulatory Visit: Payer: Self-pay

## 2021-04-22 ENCOUNTER — Ambulatory Visit: Payer: 59 | Admitting: Family Medicine

## 2021-04-22 ENCOUNTER — Encounter: Payer: Self-pay | Admitting: Family Medicine

## 2021-04-22 VITALS — BP 139/89 | HR 77 | Temp 97.7°F | Ht 62.0 in | Wt 167.6 lb

## 2021-04-22 DIAGNOSIS — E1169 Type 2 diabetes mellitus with other specified complication: Secondary | ICD-10-CM | POA: Diagnosis not present

## 2021-04-22 DIAGNOSIS — E785 Hyperlipidemia, unspecified: Secondary | ICD-10-CM

## 2021-04-22 DIAGNOSIS — E119 Type 2 diabetes mellitus without complications: Secondary | ICD-10-CM

## 2021-04-22 DIAGNOSIS — R718 Other abnormality of red blood cells: Secondary | ICD-10-CM | POA: Diagnosis not present

## 2021-04-22 DIAGNOSIS — F17200 Nicotine dependence, unspecified, uncomplicated: Secondary | ICD-10-CM | POA: Diagnosis not present

## 2021-04-22 MED ORDER — ROSUVASTATIN CALCIUM 5 MG PO TABS: 5.0000 mg | ORAL_TABLET | Freq: Every day | ORAL | 1 refills | Status: DC

## 2021-04-22 MED ORDER — METFORMIN HCL 500 MG PO TABS: ORAL_TABLET | ORAL | 1 refills | Status: DC

## 2021-04-22 NOTE — Progress Notes (Signed)
Patient ID: [name], female    DOB: [birth year], 45 y.o.   MRN: [id number]   Chief Complaint  Patient presents with   Hyperlipidemia    Follow up Discuss recent labs   Subjective:    HPI  HLD- doing well no new concerns.  Compliant with meds. No chest pain, palpitations, myalgias or joint pains.  Pt has low iron and taking iron tab 2x per week. Taking oral pills 2x per week. Some months having heavy periods.  Seeing Elizabeth Jones NP in past.  [name] wanting to see gyn for heavy periods.  Wheezing- today,  Smoker.  Not wanting to see pulm.  Medical History [name] has a past medical history of Anxiety, Asthma, Depression, Hypertension, Insomnia, and Reflux.   Outpatient Encounter Medications as of 04/22/2021  Medication Sig   cetirizine (ZYRTEC) 10 MG tablet Take 1 tablet (10 mg total) by mouth daily.   IRON PO Take 65 mg by mouth.   metFORMIN (GLUCOPHAGE) 500 MG tablet TAKE 1 TABLET BY MOUTH ONCE DAILY with food.   ONETOUCH VERIO test strip USE 1 STRIP TO CHECK GLUCOSE ONCE DAILY AS DIRECTED   rosuvastatin (CRESTOR) 5 MG tablet Take 1 tablet (5 mg total) by mouth daily. for cholesterol.   [DISCONTINUED] metFORMIN (GLUCOPHAGE) 500 MG tablet TAKE 1 TABLET BY MOUTH ONCE DAILY. NEEDS OFFICE VISIT   [DISCONTINUED] rosuvastatin (CRESTOR) 5 MG tablet TAKE 1 TABLET BY MOUTH ONCE DAILY FOR CHOLESTEROL   No facility-administered encounter medications on file as of 04/22/2021.     Review of Systems  Constitutional:  Negative for chills and fever.  HENT:  Negative for congestion, rhinorrhea and sore throat.   Respiratory:  Negative for cough, shortness of breath and wheezing.   Cardiovascular:  Negative for chest pain and leg swelling.  Gastrointestinal:  Negative for abdominal pain, diarrhea, nausea and vomiting.  Genitourinary:  Negative for dysuria and frequency.  Musculoskeletal:  Negative for arthralgias and back pain.  Skin:  Negative for rash.  Neurological:  Negative for  dizziness, weakness and headaches.    Vitals BP 139/89   Pulse 77   Temp 97.7 F (36.5 C) (Oral)   Ht 5\' 2"  (1.575 m)   Wt 167 lb 9.6 oz (76 kg)   SpO2 99%   BMI 30.65 kg/m   Objective:   Physical Exam Vitals and nursing note reviewed.  Constitutional:      General: She is not in acute distress.    Appearance: Normal appearance.  HENT:     Head: Normocephalic and atraumatic.     Nose: Nose normal.     Mouth/Throat:     Mouth: Mucous membranes are moist.     Pharynx: Oropharynx is clear.  Eyes:     Extraocular Movements: Extraocular movements intact.     Conjunctiva/sclera: Conjunctivae normal.     Pupils: Pupils are equal, round, and reactive to light.  Cardiovascular:     Rate and Rhythm: Normal rate and regular rhythm.     Pulses: Normal pulses.     Heart sounds: Normal heart sounds.  Pulmonary:     Effort: Pulmonary effort is normal.     Breath sounds: Normal breath sounds. No wheezing, rhonchi or rales.  Musculoskeletal:        General: Normal range of motion.     Right lower leg: No edema.     Left lower leg: No edema.  Skin:    General: Skin is warm and dry.  Findings: No lesion or rash.  Neurological:     General: No focal deficit present.     Mental Status: She is alert and oriented to person, place, and time.  Psychiatric:        Mood and Affect: Mood normal.        Behavior: Behavior normal.     Assessment and Plan   1. Type 2 diabetes mellitus without complication, without long-term current use of insulin (HCC) - metFORMIN (GLUCOPHAGE) 500 MG tablet; TAKE 1 TABLET BY MOUTH ONCE DAILY with food.  Dispense: 90 tablet; Refill: 1  2. Hyperlipidemia associated with type 2 diabetes mellitus (HCC) - rosuvastatin (CRESTOR) 5 MG tablet; Take 1 tablet (5 mg total) by mouth daily. for cholesterol.  Dispense: 90 tablet; Refill: 1  3. Smoker  4. Abnormal RBC   Dm2- stable cont meds.   HLD-  stable. Cont meds.  Pt not happy with having to come in  to see new provider to get refills on her diabetic and cholesterol meds.   I reviewed with her that a new provider starting in 10/22 and she can call back to get on schedule with them or she would like she can find a new provider in [facility] system if she would like.  Not wanting to quit smoking.  Elevated RBC-  H/o iron def anemia- taking iron tabs.  Pt to f/u and recheck on next visit.  Return in about 6 months (around 10/23/2021) for dm2, hld.

## 2021-10-08 ENCOUNTER — Other Ambulatory Visit: Payer: Self-pay

## 2021-10-08 ENCOUNTER — Ambulatory Visit: Payer: 59 | Admitting: Family Medicine

## 2021-10-08 VITALS — BP 150/90 | HR 117 | Temp 98.7°F | Ht 62.0 in | Wt 166.6 lb

## 2021-10-08 DIAGNOSIS — E1169 Type 2 diabetes mellitus with other specified complication: Secondary | ICD-10-CM

## 2021-10-08 DIAGNOSIS — D509 Iron deficiency anemia, unspecified: Secondary | ICD-10-CM

## 2021-10-08 DIAGNOSIS — E119 Type 2 diabetes mellitus without complications: Secondary | ICD-10-CM

## 2021-10-08 DIAGNOSIS — T7840XA Allergy, unspecified, initial encounter: Secondary | ICD-10-CM

## 2021-10-08 DIAGNOSIS — E785 Hyperlipidemia, unspecified: Secondary | ICD-10-CM

## 2021-10-08 DIAGNOSIS — R030 Elevated blood-pressure reading, without diagnosis of hypertension: Secondary | ICD-10-CM | POA: Insufficient documentation

## 2021-10-08 MED ORDER — CETIRIZINE HCL 10 MG PO TABS: 10.0000 mg | ORAL_TABLET | Freq: Every day | ORAL | 3 refills | Status: DC

## 2021-10-08 MED ORDER — METFORMIN HCL 500 MG PO TABS: ORAL_TABLET | ORAL | 3 refills | Status: DC

## 2021-10-08 MED ORDER — ROSUVASTATIN CALCIUM 5 MG PO TABS: 5.0000 mg | ORAL_TABLET | Freq: Every day | ORAL | 3 refills | Status: DC

## 2021-10-08 NOTE — Assessment & Plan Note (Signed)
Crestor refilled.  Labs ordered.  Has been stable.

## 2021-10-08 NOTE — Progress Notes (Signed)
Subjective:  Patient ID: [name], female    DOB: 1975/11/30  Age: 46 y.o. MRN: [id number]  CC: Chief Complaint  Patient presents with   Establish Care   Follow-up    Has been out of Crestor and needs refills on other medications.    HPI:  46 year old female presents to establish care with me.  DM-2 Recent blood sugar 127. Currently on Metformin 500 mg daily.   She endorses compliance with therapy.  Needs A1c.  Last A1c was 6.7.  Iron deficiency anemia She is currently on p.o. iron therapy which she takes twice a week. Needs labs.  Last hemoglobin was 12.4.  Hyperlipidemia Most recent labs reflect good control with an LDL of 87.  She is on Crestor.  Needs refill. Needs labs.   Patient Active Problem List   Diagnosis Date Noted   Elevated BP without diagnosis of hypertension 10/08/2021   Smoker 04/22/2021   Hyperlipidemia associated with type 2 diabetes mellitus ([place]) 09/06/2020   Allergies 09/06/2020   Type 2 diabetes mellitus without complication, without long-term current use of insulin ([place]) 12/15/2017   Iron deficiency anemia 12/10/2017   Vitamin D deficiency 12/10/2017    Social Hx   Social History   Socioeconomic History   Marital status: Married    Spouse name: Not on file   Number of children: Not on file   Years of education: Not on file   Highest education level: Not on file  Occupational History   Not on file  Tobacco Use   Smoking status: Every Day   Smokeless tobacco: Never  Substance and Sexual Activity   Alcohol use: No   Drug use: No   Sexual activity: Yes    Birth control/protection: Other-see comments    Comment: partner vasectomy  Other Topics Concern   Not on file  Social History Narrative   Not on file   Social Determinants of Health   Financial Resource Strain: Not on file  Food Insecurity: Not on file  Transportation Needs: Not on file  Physical Activity: Not on file  Stress: Not on file  Social Connections: Not  on file    Review of Systems  Constitutional: Negative.   Respiratory: Negative.    Cardiovascular: Negative.     Objective:  BP (!) 150/90    Pulse (!) 117    Temp 98.7 F (37.1 C) (Oral)    Ht '5\' 2"'  (1.575 m)    Wt 166 lb 9.6 oz (75.6 kg)    SpO2 97%    BMI 30.47 kg/m   BP/Weight 10/08/2021 04/22/2021 58/85/0277  Systolic BP 412 878 676  Diastolic BP 90 89 88  Wt. (Lbs) 166.6 167.6 171.2  BMI 30.47 30.65 31.31    Physical Exam Constitutional:      General: She is not in acute distress.    Appearance: Normal appearance. She is not ill-appearing.  HENT:     Head: Normocephalic and atraumatic.  Cardiovascular:     Rate and Rhythm: Regular rhythm. Tachycardia present.  Pulmonary:     Effort: Pulmonary effort is normal.     Breath sounds: Normal breath sounds. No wheezing, rhonchi or rales.  Neurological:     Mental Status: She is alert.  Psychiatric:        Mood and Affect: Mood normal.        Behavior: Behavior normal.    Lab Results  Component Value Date   WBC 8.8 04/19/2021   HGB 12.4 04/19/2021  HCT 40.2 04/19/2021   PLT 213 04/19/2021   GLUCOSE 132 (H) 04/19/2021   CHOL 155 05/24/2020   TRIG 159 (H) 05/24/2020   HDL 40 05/24/2020   LDLCALC 87 05/24/2020   ALT 23 04/19/2021   AST 25 04/19/2021   NA 139 04/19/2021   K 4.1 04/19/2021   CL 105 04/19/2021   CREATININE 0.72 04/19/2021   BUN 14 04/19/2021   CO2 21 04/19/2021   TSH 1.390 02/17/2020   HGBA1C 6.7 (H) 04/19/2021     Assessment & Plan:   Problem List Items Addressed This Visit       Endocrine   Type 2 diabetes mellitus without complication, without long-term current use of insulin ([place]) - Primary    A1c ordered.  Continue metformin.  May need to increase metformin pending A1c results.  Metformin was refilled today.      Relevant Medications   rosuvastatin (CRESTOR) 5 MG tablet   metFORMIN (GLUCOPHAGE) 500 MG tablet   Other Relevant Orders   CMP14+EGFR   Microalbumin / creatinine  urine ratio   Hyperlipidemia associated with type 2 diabetes mellitus ([place])    Crestor refilled.  Labs ordered.  Has been stable.      Relevant Medications   rosuvastatin (CRESTOR) 5 MG tablet   metFORMIN (GLUCOPHAGE) 500 MG tablet   Other Relevant Orders   Lipid panel     Other   Iron deficiency anemia   Relevant Orders   CBC   Iron, TIBC and Ferritin Panel   Allergies   Relevant Medications   cetirizine (ZYRTEC) 10 MG tablet   Elevated BP without diagnosis of hypertension    BP elevated today.  Advised to keep a close monitor of her BP at home.  May need pharmacotherapy if persist.       Meds ordered this encounter  Medications   rosuvastatin (CRESTOR) 5 MG tablet    Sig: Take 1 tablet (5 mg total) by mouth daily. for cholesterol.    Dispense:  90 tablet    Refill:  3   metFORMIN (GLUCOPHAGE) 500 MG tablet    Sig: TAKE 1 TABLET BY MOUTH ONCE DAILY with food.    Dispense:  90 tablet    Refill:  3   cetirizine (ZYRTEC) 10 MG tablet    Sig: Take 1 tablet (10 mg total) by mouth daily.    Dispense:  90 tablet    Refill:  3    Follow-up:  Return in about 6 months (around 04/07/2022).  [place]

## 2021-10-08 NOTE — Patient Instructions (Signed)
Labs today.  I have refilled your medications.  Keep an eye on your BP (check at home please; goal <140/90)  Follow up in 6 months

## 2021-10-08 NOTE — Assessment & Plan Note (Signed)
A1c ordered.  Continue metformin.  May need to increase metformin pending A1c results.  Metformin was refilled today.

## 2021-10-08 NOTE — Assessment & Plan Note (Signed)
BP elevated today.  Advised to keep a close monitor of her BP at home.  May need pharmacotherapy if persist.

## 2021-10-09 LAB — MICROALBUMIN / CREATININE URINE RATIO
Creatinine, Urine: 261.7 mg/dL
Microalb/Creat Ratio: 5 mg/g creat (ref 0–29)
Microalbumin, Urine: 14 ug/mL

## 2021-10-09 LAB — CMP14+EGFR
ALT: 20 IU/L (ref 0–32)
AST: 21 IU/L (ref 0–40)
Albumin/Globulin Ratio: 1.6 (ref 1.2–2.2)
Albumin: 4.5 g/dL (ref 3.8–4.8)
Alkaline Phosphatase: 62 IU/L (ref 44–121)
BUN/Creatinine Ratio: 21 (ref 9–23)
BUN: 16 mg/dL (ref 6–24)
Bilirubin Total: 0.3 mg/dL (ref 0.0–1.2)
CO2: 23 mmol/L (ref 20–29)
Calcium: 9.4 mg/dL (ref 8.7–10.2)
Chloride: 102 mmol/L (ref 96–106)
Creatinine, Ser: 0.78 mg/dL (ref 0.57–1.00)
Globulin, Total: 2.8 g/dL (ref 1.5–4.5)
Glucose: 123 mg/dL — ABNORMAL HIGH (ref 70–99)
Potassium: 4.1 mmol/L (ref 3.5–5.2)
Sodium: 138 mmol/L (ref 134–144)
Total Protein: 7.3 g/dL (ref 6.0–8.5)
eGFR: 97 mL/min/{1.73_m2} (ref >59)

## 2021-10-09 LAB — LIPID PANEL
Chol/HDL Ratio: 3.2 ratio (ref 0.0–4.4)
Cholesterol, Total: 139 mg/dL (ref 100–199)
HDL: 43 mg/dL (ref >39)
LDL Chol Calc (NIH): 76 mg/dL (ref 0–99)
Triglycerides: 112 mg/dL (ref 0–149)
VLDL Cholesterol Cal: 20 mg/dL (ref 5–40)

## 2021-10-09 LAB — CBC
Hematocrit: 42 % (ref 34.0–46.6)
Hemoglobin: 13.2 g/dL (ref 11.1–15.9)
MCH: 24.9 pg — ABNORMAL LOW (ref 26.6–33.0)
MCHC: 31.4 g/dL — ABNORMAL LOW (ref 31.5–35.7)
MCV: 79 fL (ref 79–97)
Platelets: 219 10*3/uL (ref 150–450)
RBC: 5.31 x10E6/uL — ABNORMAL HIGH (ref 3.77–5.28)
RDW: 15.9 % — ABNORMAL HIGH (ref 11.7–15.4)
WBC: 9 10*3/uL (ref 3.4–10.8)

## 2021-10-09 LAB — IRON,TIBC AND FERRITIN PANEL
Ferritin: 9 ng/mL — ABNORMAL LOW (ref 15–150)
Iron Saturation: 5 % — CL (ref 15–55)
Iron: 20 ug/dL — ABNORMAL LOW (ref 27–159)
Total Iron Binding Capacity: 372 ug/dL (ref 250–450)
UIBC: 352 ug/dL (ref 131–425)

## 2021-10-09 LAB — SPECIMEN STATUS REPORT

## 2021-10-14 LAB — SPECIMEN STATUS REPORT

## 2021-10-14 LAB — HGB A1C W/O EAG: Hgb A1c MFr Bld: 6.6 % — ABNORMAL HIGH (ref 4.8–5.6)

## 2022-04-07 ENCOUNTER — Ambulatory Visit: Payer: 59 | Admitting: Family Medicine

## 2022-04-07 ENCOUNTER — Encounter: Payer: Self-pay | Admitting: Family Medicine

## 2022-04-07 VITALS — BP 136/84 | HR 83 | Temp 98.3°F | Wt 166.4 lb

## 2022-04-07 DIAGNOSIS — Z720 Tobacco use: Secondary | ICD-10-CM

## 2022-04-07 DIAGNOSIS — D509 Iron deficiency anemia, unspecified: Secondary | ICD-10-CM

## 2022-04-07 DIAGNOSIS — E119 Type 2 diabetes mellitus without complications: Secondary | ICD-10-CM | POA: Diagnosis not present

## 2022-04-07 DIAGNOSIS — E1169 Type 2 diabetes mellitus with other specified complication: Secondary | ICD-10-CM | POA: Diagnosis not present

## 2022-04-07 DIAGNOSIS — E559 Vitamin D deficiency, unspecified: Secondary | ICD-10-CM | POA: Diagnosis not present

## 2022-04-07 DIAGNOSIS — E785 Hyperlipidemia, unspecified: Secondary | ICD-10-CM

## 2022-04-07 NOTE — Progress Notes (Signed)
Subjective:  Patient ID: [name], female    DOB: 1976/09/12  Age: 46 y.o. MRN: [id number]  CC: Chief Complaint  Patient presents with   Diabetes    Pt checking sugars q a.m. No abnormalities. Does see eye dr.     HPI:  46 year old female with type 2 diabetes, hyperlipidemia, vitamin D deficiency, tobacco abuse, iron deficiency presents for follow-up.  Blood pressures are stable.  No pharmacotherapy at this time.  A1c has been at goal.  Last A1c was 6.6.  Needs labs.  She is currently on metformin and doing well.  Lipids have been well controlled on Crestor.  Needs labs.  Patient's anemia has been stable.  She is on iron therapy.  She states that her menstrual cycles are not as heavy as it used to be.  States that they last approximately 3 to 4 days.  Heavier on the first 2 days.  Continues to smoke.  Is not interested in cessation at this time.  Patient Active Problem List   Diagnosis Date Noted   Tobacco abuse 04/07/2022   Elevated BP without diagnosis of hypertension 10/08/2021   Hyperlipidemia associated with type 2 diabetes mellitus ([place]) 09/06/2020   Type 2 diabetes mellitus without complication, without long-term current use of insulin ([place]) 12/15/2017   Iron deficiency anemia 12/10/2017   Vitamin D deficiency 12/10/2017    Social Hx   Social History   Socioeconomic History   Marital status: Married    Spouse name: Not on file   Number of children: Not on file   Years of education: Not on file   Highest education level: Not on file  Occupational History   Not on file  Tobacco Use   Smoking status: Every Day   Smokeless tobacco: Never  Substance and Sexual Activity   Alcohol use: No   Drug use: No   Sexual activity: Yes    Birth control/protection: Other-see comments    Comment: partner vasectomy  Other Topics Concern   Not on file  Social History Narrative   Not on file   Social Determinants of Health   Financial Resource Strain: Not on  file  Food Insecurity: Not on file  Transportation Needs: Not on file  Physical Activity: Not on file  Stress: Not on file  Social Connections: Not on file    Review of Systems  Constitutional: Negative.   Respiratory: Negative.    Cardiovascular: Negative.     Objective:  BP 136/84   Pulse 83   Temp 98.3 F (36.8 C)   Wt 166 lb 6.4 oz (75.5 kg)   SpO2 95%   BMI 30.43 kg/m      04/07/2022    9:56 AM 10/08/2021    8:32 AM 04/22/2021    2:06 PM  BP/Weight  Systolic BP 967 893 810  Diastolic BP 84 90 89  Wt. (Lbs) 166.4 166.6 167.6  BMI 30.43 kg/m2 30.47 kg/m2 30.65 kg/m2    Physical Exam Vitals and nursing note reviewed.  Constitutional:      General: She is not in acute distress.    Appearance: Normal appearance.  HENT:     Head: Normocephalic and atraumatic.  Cardiovascular:     Rate and Rhythm: Normal rate and regular rhythm.  Pulmonary:     Effort: Pulmonary effort is normal.     Breath sounds: Normal breath sounds. No wheezing, rhonchi or rales.  Neurological:     Mental Status: She is alert.  Psychiatric:  Mood and Affect: Mood normal.        Behavior: Behavior normal.     Lab Results  Component Value Date   WBC 9.0 10/08/2021   HGB 13.2 10/08/2021   HCT 42.0 10/08/2021   PLT 219 10/08/2021   GLUCOSE 123 (H) 10/08/2021   CHOL 139 10/08/2021   TRIG 112 10/08/2021   HDL 43 10/08/2021   LDLCALC 76 10/08/2021   ALT 20 10/08/2021   AST 21 10/08/2021   NA 138 10/08/2021   K 4.1 10/08/2021   CL 102 10/08/2021   CREATININE 0.78 10/08/2021   BUN 16 10/08/2021   CO2 23 10/08/2021   TSH 1.390 02/17/2020   HGBA1C 6.6 (H) 10/08/2021     Assessment & Plan:   Problem List Items Addressed This Visit       Endocrine   Type 2 diabetes mellitus without complication, without long-term current use of insulin ([place]) - Primary    Stable.  Continue metformin.  Labs today.      Relevant Orders   CMP14+EGFR   Hemoglobin A1c   Hyperlipidemia  associated with type 2 diabetes mellitus ([place])    Well-controlled.  Continue Crestor.  Labs today.      Relevant Orders   Lipid panel     Other   Vitamin D deficiency    Vitamin D level today.      Relevant Orders   Vitamin D, 25-hydroxy   Tobacco abuse    Advised that she needs to quit for her health.  Patient not interested in cessation at this time.      Iron deficiency anemia    Labs today.  Continue iron therapy.      Relevant Orders   CBC   Iron, TIBC and Ferritin Panel   Follow-up:  Return in about 6 months (around 10/08/2022).  [place]

## 2022-04-07 NOTE — Assessment & Plan Note (Signed)
Well-controlled.  Continue Crestor.  Labs today.

## 2022-04-07 NOTE — Patient Instructions (Signed)
Be sure to get your mammogram.  Follow up for pap smear.  Labs today.  Continue your meds.  Take care  Dr. Adriana Simas

## 2022-04-07 NOTE — Assessment & Plan Note (Signed)
Vitamin D level today

## 2022-04-07 NOTE — Assessment & Plan Note (Signed)
Advised that she needs to quit for her health.  Patient not interested in cessation at this time.

## 2022-04-07 NOTE — Assessment & Plan Note (Signed)
Labs today.  Continue iron therapy.

## 2022-04-07 NOTE — Assessment & Plan Note (Signed)
Stable.  Continue metformin.  Labs today.

## 2022-06-02 LAB — HM DIABETES EYE EXAM

## 2022-06-13 ENCOUNTER — Encounter: Payer: Self-pay | Admitting: *Deleted

## 2022-06-20 ENCOUNTER — Encounter: Payer: 59 | Admitting: Nurse Practitioner

## 2022-10-09 ENCOUNTER — Ambulatory Visit: Payer: 59 | Admitting: Family Medicine

## 2022-10-09 VITALS — BP 167/92 | HR 75 | Temp 97.3°F | Ht 62.0 in | Wt 165.0 lb

## 2022-10-09 DIAGNOSIS — D509 Iron deficiency anemia, unspecified: Secondary | ICD-10-CM

## 2022-10-09 DIAGNOSIS — R030 Elevated blood-pressure reading, without diagnosis of hypertension: Secondary | ICD-10-CM | POA: Diagnosis not present

## 2022-10-09 DIAGNOSIS — R748 Abnormal levels of other serum enzymes: Secondary | ICD-10-CM

## 2022-10-09 DIAGNOSIS — E119 Type 2 diabetes mellitus without complications: Secondary | ICD-10-CM

## 2022-10-09 DIAGNOSIS — E1169 Type 2 diabetes mellitus with other specified complication: Secondary | ICD-10-CM | POA: Diagnosis not present

## 2022-10-09 DIAGNOSIS — E785 Hyperlipidemia, unspecified: Secondary | ICD-10-CM

## 2022-10-09 MED ORDER — METFORMIN HCL 500 MG PO TABS: ORAL_TABLET | ORAL | 3 refills | Status: DC

## 2022-10-09 MED ORDER — ROSUVASTATIN CALCIUM 5 MG PO TABS: 5.0000 mg | ORAL_TABLET | Freq: Every day | ORAL | 3 refills | Status: DC

## 2022-10-09 NOTE — Assessment & Plan Note (Signed)
Well-controlled.  A1c ordered.  Continue metformin.

## 2022-10-09 NOTE — Progress Notes (Signed)
Subjective:  Patient ID: [name], female    DOB: [birth year]  Age: 47 y.o. MRN: [id number]  CC: Chief Complaint  Patient presents with   Diabetes    6 month follow up    HPI:  47 year old female with the below mentioned medical problems presents for follow-up.  Patient is feeling well.  She has no complaints or concerns at this time.  Patient has cut back on her smoking but is not yet ready to quit.  Blood pressure is significantly elevated today.  She has had elevated BPs in the past.  Needs to check at home to ensure no whitecoat hypertension.  Overdue for Pap smear.  Patient will get this later this year.  Patient needs labs today.  Diabetes has been well-controlled on metformin.  Needs A1c.  Lipids have been stable on Crestor.  Tolerating.  Patient Active Problem List   Diagnosis Date Noted   Tobacco abuse 04/07/2022   Elevated BP without diagnosis of hypertension 10/08/2021   Hyperlipidemia associated with type 2 diabetes mellitus ([place]) 09/06/2020   Type 2 diabetes mellitus without complication, without long-term current use of insulin ([place]) 12/15/2017   Iron deficiency anemia 12/10/2017   Vitamin D deficiency 12/10/2017    Social Hx   Social History   Socioeconomic History   Marital status: Married    Spouse name: Not on file   Number of children: Not on file   Years of education: Not on file   Highest education level: Not on file  Occupational History   Not on file  Tobacco Use   Smoking status: Every Day   Smokeless tobacco: Never  Substance and Sexual Activity   Alcohol use: No   Drug use: No   Sexual activity: Yes    Birth control/protection: Other-see comments    Comment: partner vasectomy  Other Topics Concern   Not on file  Social History Narrative   Not on file   Social Determinants of Health   Financial Resource Strain: Not on file  Food Insecurity: Not on file  Transportation Needs: Not on file  Physical Activity: Not on file   Stress: Not on file  Social Connections: Not on file    Review of Systems  Constitutional: Negative.   Respiratory: Negative.    Cardiovascular: Negative.     Objective:  BP (!) 167/92   Pulse 75   Temp (!) 97.3 F (36.3 C)   Ht 5\' 2"  (1.575 m)   Wt 165 lb (74.8 kg)   SpO2 96%   BMI 30.18 kg/m      10/09/2022    1:28 PM 10/09/2022    1:08 PM 04/07/2022    9:56 AM  BP/Weight  Systolic BP 629 528 413  Diastolic BP 92 88 84  Wt. (Lbs)  165 166.4  BMI  30.18 kg/m2 30.43 kg/m2    Physical Exam Vitals and nursing note reviewed.  Constitutional:      General: She is not in acute distress.    Appearance: Normal appearance.  HENT:     Head: Normocephalic and atraumatic.  Eyes:     General:        Right eye: No discharge.        Left eye: No discharge.     Conjunctiva/sclera: Conjunctivae normal.  Cardiovascular:     Rate and Rhythm: Normal rate and regular rhythm.  Pulmonary:     Effort: Pulmonary effort is normal.     Breath sounds: Normal breath sounds.  No wheezing, rhonchi or rales.  Neurological:     Mental Status: She is alert.  Psychiatric:        Mood and Affect: Mood normal.        Behavior: Behavior normal.     Lab Results  Component Value Date   WBC 9.0 10/08/2021   HGB 13.2 10/08/2021   HCT 42.0 10/08/2021   PLT 219 10/08/2021   GLUCOSE 123 (H) 10/08/2021   CHOL 139 10/08/2021   TRIG 112 10/08/2021   HDL 43 10/08/2021   LDLCALC 76 10/08/2021   ALT 20 10/08/2021   AST 21 10/08/2021   NA 138 10/08/2021   K 4.1 10/08/2021   CL 102 10/08/2021   CREATININE 0.78 10/08/2021   BUN 16 10/08/2021   CO2 23 10/08/2021   TSH 1.390 02/17/2020   HGBA1C 6.6 (H) 10/08/2021     Assessment & Plan:   Problem List Items Addressed This Visit       Endocrine   Hyperlipidemia associated with type 2 diabetes mellitus ([place])    Stable on Crestor.  Continue.      Relevant Medications   rosuvastatin (CRESTOR) 5 MG tablet   metFORMIN (GLUCOPHAGE) 500  MG tablet   Other Relevant Orders   Lipid panel   Type 2 diabetes mellitus without complication, without long-term current use of insulin ([facility])    Well-controlled.  A1c ordered.  Continue metformin.      Relevant Medications   rosuvastatin (CRESTOR) 5 MG tablet   metFORMIN (GLUCOPHAGE) 500 MG tablet   Other Relevant Orders   CMP14+EGFR   Hemoglobin A1c   Microalbumin / creatinine urine ratio     Other   Elevated BP without diagnosis of hypertension    Advised to check BP at home.  Patient will let me know if blood pressure remains elevated.  Goal less than 130/80.      Iron deficiency anemia   Relevant Orders   CBC   Iron, TIBC and Ferritin Panel    Meds ordered this encounter  Medications   rosuvastatin (CRESTOR) 5 MG tablet    Sig: Take 1 tablet (5 mg total) by mouth daily. for cholesterol.    Dispense:  90 tablet    Refill:  3   metFORMIN (GLUCOPHAGE) 500 MG tablet    Sig: TAKE 1 TABLET BY MOUTH ONCE DAILY with food.    Dispense:  90 tablet    Refill:  3    Follow-up:  Return in about 6 months (around 04/09/2023).  [place]

## 2022-10-09 NOTE — Assessment & Plan Note (Signed)
Advised to check BP at home.  Patient will let me know if blood pressure remains elevated.  Goal less than 130/80.

## 2022-10-09 NOTE — Patient Instructions (Signed)
Get yourself a BP cuff and check BP daily at home. Goal is <130/80.  Continue cutting back on the smoking.  Follow up in 6 months.   Take care  Dr. Lacinda Axon

## 2022-10-09 NOTE — Assessment & Plan Note (Signed)
Stable on Crestor.  Continue. 

## 2022-10-16 LAB — CMP14+EGFR
ALT: 46 IU/L — ABNORMAL HIGH (ref 0–32)
AST: 45 IU/L — ABNORMAL HIGH (ref 0–40)
Albumin/Globulin Ratio: 1.6 (ref 1.2–2.2)
Albumin: 4.5 g/dL (ref 3.9–4.9)
Alkaline Phosphatase: 56 IU/L (ref 44–121)
BUN/Creatinine Ratio: 21 (ref 9–23)
BUN: 15 mg/dL (ref 6–24)
Bilirubin Total: 0.7 mg/dL (ref 0.0–1.2)
CO2: 21 mmol/L (ref 20–29)
Calcium: 9.6 mg/dL (ref 8.7–10.2)
Chloride: 100 mmol/L (ref 96–106)
Creatinine, Ser: 0.72 mg/dL (ref 0.57–1.00)
Globulin, Total: 2.8 g/dL (ref 1.5–4.5)
Glucose: 198 mg/dL — ABNORMAL HIGH (ref 70–99)
Potassium: 4.3 mmol/L (ref 3.5–5.2)
Sodium: 138 mmol/L (ref 134–144)
Total Protein: 7.3 g/dL (ref 6.0–8.5)
eGFR: 106 mL/min/{1.73_m2} (ref >59)

## 2022-10-16 LAB — CBC
Hematocrit: 47.9 % — ABNORMAL HIGH (ref 34.0–46.6)
Hemoglobin: 15.7 g/dL (ref 11.1–15.9)
MCH: 31.1 pg (ref 26.6–33.0)
MCHC: 32.8 g/dL (ref 31.5–35.7)
MCV: 95 fL (ref 79–97)
Platelets: 183 10*3/uL (ref 150–450)
RBC: 5.05 x10E6/uL (ref 3.77–5.28)
RDW: 13.4 % (ref 11.7–15.4)
WBC: 6.5 10*3/uL (ref 3.4–10.8)

## 2022-10-16 LAB — LIPID PANEL
Chol/HDL Ratio: 3 ratio (ref 0.0–4.4)
Cholesterol, Total: 146 mg/dL (ref 100–199)
HDL: 49 mg/dL (ref >39)
LDL Chol Calc (NIH): 78 mg/dL (ref 0–99)
Triglycerides: 104 mg/dL (ref 0–149)
VLDL Cholesterol Cal: 19 mg/dL (ref 5–40)

## 2022-10-16 LAB — IRON,TIBC AND FERRITIN PANEL
Ferritin: 86 ng/mL (ref 15–150)
Iron Saturation: 27 % (ref 15–55)
Iron: 81 ug/dL (ref 27–159)
Total Iron Binding Capacity: 297 ug/dL (ref 250–450)
UIBC: 216 ug/dL (ref 131–425)

## 2022-10-16 LAB — MICROALBUMIN / CREATININE URINE RATIO
Creatinine, Urine: 199.8 mg/dL
Microalb/Creat Ratio: 19 mg/g creat (ref 0–29)
Microalbumin, Urine: 37.3 ug/mL

## 2022-10-16 LAB — HEMOGLOBIN A1C
Est. average glucose Bld gHb Est-mCnc: 123 mg/dL
Hgb A1c MFr Bld: 5.9 % — ABNORMAL HIGH (ref 4.8–5.6)

## 2022-10-18 ENCOUNTER — Encounter (HOSPITAL_COMMUNITY): Payer: Self-pay | Admitting: *Deleted

## 2022-10-18 ENCOUNTER — Other Ambulatory Visit: Payer: Self-pay

## 2022-10-18 ENCOUNTER — Emergency Department (HOSPITAL_COMMUNITY)
Admission: EM | Admit: 2022-10-18 | Discharge: 2022-10-18 | Disposition: A | Discharge status: Home / Self Care | Payer: 59 | Attending: Emergency Medicine | Admitting: Emergency Medicine

## 2022-10-18 ENCOUNTER — Emergency Department (HOSPITAL_COMMUNITY): Payer: 59

## 2022-10-18 DIAGNOSIS — E119 Type 2 diabetes mellitus without complications: Secondary | ICD-10-CM | POA: Insufficient documentation

## 2022-10-18 DIAGNOSIS — R519 Headache, unspecified: Secondary | ICD-10-CM | POA: Diagnosis present

## 2022-10-18 DIAGNOSIS — Z7984 Long term (current) use of oral hypoglycemic drugs: Secondary | ICD-10-CM | POA: Insufficient documentation

## 2022-10-18 DIAGNOSIS — I160 Hypertensive urgency: Secondary | ICD-10-CM | POA: Insufficient documentation

## 2022-10-18 DIAGNOSIS — Z79899 Other long term (current) drug therapy: Secondary | ICD-10-CM | POA: Insufficient documentation

## 2022-10-18 HISTORY — DX: Pure hypercholesterolemia, unspecified: E78.00

## 2022-10-18 HISTORY — DX: Anemia, unspecified: D64.9

## 2022-10-18 HISTORY — DX: Type 2 diabetes mellitus without complications: E11.9

## 2022-10-18 LAB — COMPREHENSIVE METABOLIC PANEL
ALT: 41 U/L (ref 0–44)
AST: 40 U/L (ref 15–41)
Albumin: 4 g/dL (ref 3.5–5.0)
Alkaline Phosphatase: 45 U/L (ref 38–126)
Anion gap: 8 (ref 5–15)
BUN: 14 mg/dL (ref 6–20)
CO2: 25 mmol/L (ref 22–32)
Calcium: 9.1 mg/dL (ref 8.9–10.3)
Chloride: 101 mmol/L (ref 98–111)
Creatinine, Ser: 0.69 mg/dL (ref 0.44–1.00)
GFR, Estimated: >60 mL/min (ref >60)
Glucose, Bld: 107 mg/dL — ABNORMAL HIGH (ref 70–99)
Potassium: 3.5 mmol/L (ref 3.5–5.1)
Sodium: 134 mmol/L — ABNORMAL LOW (ref 135–145)
Total Bilirubin: 1 mg/dL (ref 0.3–1.2)
Total Protein: 7.1 g/dL (ref 6.5–8.1)

## 2022-10-18 LAB — DIFFERENTIAL
Abs Immature Granulocytes: 0.01 10*3/uL (ref 0.00–0.07)
Basophils Absolute: 0 10*3/uL (ref 0.0–0.1)
Basophils Relative: 1 %
Eosinophils Absolute: 0.1 10*3/uL (ref 0.0–0.5)
Eosinophils Relative: 2 %
Immature Granulocytes: 0 %
Lymphocytes Relative: 39 %
Lymphs Abs: 2.6 10*3/uL (ref 0.7–4.0)
Monocytes Absolute: 0.6 10*3/uL (ref 0.1–1.0)
Monocytes Relative: 9 %
Neutro Abs: 3.4 10*3/uL (ref 1.7–7.7)
Neutrophils Relative %: 49 %

## 2022-10-18 LAB — CBC
HCT: 44.1 % (ref 36.0–46.0)
Hemoglobin: 15.2 g/dL — ABNORMAL HIGH (ref 12.0–15.0)
MCH: 31.3 pg (ref 26.0–34.0)
MCHC: 34.5 g/dL (ref 30.0–36.0)
MCV: 90.7 fL (ref 80.0–100.0)
Platelets: 187 10*3/uL (ref 150–400)
RBC: 4.86 MIL/uL (ref 3.87–5.11)
RDW: 13 % (ref 11.5–15.5)
WBC: 6.7 10*3/uL (ref 4.0–10.5)
nRBC: 0 % (ref 0.0–0.2)

## 2022-10-18 LAB — POC URINE PREG, ED: Preg Test, Ur: NEGATIVE

## 2022-10-18 LAB — PROTIME-INR
INR: 1 (ref 0.8–1.2)
Prothrombin Time: 12.8 seconds (ref 11.4–15.2)

## 2022-10-18 LAB — RAPID URINE DRUG SCREEN, HOSP PERFORMED
Amphetamines: NOT DETECTED
Barbiturates: NOT DETECTED
Benzodiazepines: NOT DETECTED
Cocaine: NOT DETECTED
Opiates: NOT DETECTED
Tetrahydrocannabinol: POSITIVE — AB

## 2022-10-18 LAB — ETHANOL: Alcohol, Ethyl (B): <10 mg/dL (ref <10)

## 2022-10-18 LAB — APTT: aPTT: 28 seconds (ref 24–36)

## 2022-10-18 MED ORDER — LISINOPRIL 10 MG PO TABS
10.0000 mg | ORAL_TABLET | Freq: Once | ORAL | Status: AC
  Administered 2022-10-18: 10 mg via ORAL
  Filled 2022-10-18: qty 1

## 2022-10-18 MED ORDER — LISINOPRIL 10 MG PO TABS: 10.0000 mg | ORAL_TABLET | Freq: Every day | ORAL | 0 refills | Status: DC

## 2022-10-18 MED ORDER — SODIUM CHLORIDE 0.9 % IV BOLUS
1000.0000 mL | Freq: Once | INTRAVENOUS | Status: AC
  Administered 2022-10-18: 1000 mL via INTRAVENOUS

## 2022-10-18 MED ORDER — DIPHENHYDRAMINE HCL 50 MG/ML IJ SOLN
25.0000 mg | Freq: Once | INTRAMUSCULAR | Status: AC
  Administered 2022-10-18: 25 mg via INTRAVENOUS
  Filled 2022-10-18: qty 1

## 2022-10-18 MED ORDER — KETOROLAC TROMETHAMINE 15 MG/ML IJ SOLN
15.0000 mg | Freq: Once | INTRAMUSCULAR | Status: AC
  Administered 2022-10-18: 15 mg via INTRAVENOUS
  Filled 2022-10-18: qty 1

## 2022-10-18 MED ORDER — METOCLOPRAMIDE HCL 5 MG/ML IJ SOLN
10.0000 mg | Freq: Once | INTRAMUSCULAR | Status: AC
  Administered 2022-10-18: 10 mg via INTRAVENOUS
  Filled 2022-10-18: qty 2

## 2022-10-18 NOTE — ED Provider Notes (Signed)
[facility] Provider Note   CSN: [id number] Arrival date & time: 10/18/22  4650     History  Chief Complaint  Patient presents with   Hypertension    [name] is a 47 y.o. female.  HPI 48 year old female with a history of type 2 diabetes presents with headache and left-sided arm weakness.  For the past week she has been checking her blood pressure and it has been elevated after seeing her doctor and noting a high blood pressure.  She has not yet on medicine but was told to track it.  She went to bed last night around 1130 and was feeling normal.  This morning she woke up and had a moderate headache.  She also has a lot of head pressure which makes it feel like her head is going to explode though she does not have any severe or thunderclap headache.  Headache went away after ibuprofen around noon though she still has the pressure.  Ever since waking up she has noticed that her left arm feels weak and numb throughout her entire arm.  No facial, speech, or leg symptoms.  Both eyes seem a little blurry but no double vision.  No chest pain or shortness of breath.  Home Medications Prior to Admission medications   Medication Sig Start Date End Date Taking? Authorizing Provider  cetirizine (ZYRTEC) 10 MG tablet Take 1 tablet (10 mg total) by mouth daily. 10/08/21  Yes Cook, Jayce G, DO  ibuprofen (ADVIL) 200 MG tablet Take 400 mg by mouth every 6 (six) hours as needed for headache.   Yes [provider]  IRON PO Take 65 mg by mouth.   Yes [provider]  lisinopril (ZESTRIL) 10 MG tablet Take 1 tablet (10 mg total) by mouth daily. 10/18/22  Yes Sherwood Gambler, MD  metFORMIN (GLUCOPHAGE) 500 MG tablet TAKE 1 TABLET BY MOUTH ONCE DAILY with food. 10/09/22  Yes Cook, Jayce G, DO  rosuvastatin (CRESTOR) 5 MG tablet Take 1 tablet (5 mg total) by mouth daily. for cholesterol. 10/09/22  Yes Cook, Jayce G, DO  VITAMIN D,  CHOLECALCIFEROL, PO Take by mouth every other day.   Yes [provider]  ONETOUCH VERIO test strip USE 1 STRIP TO CHECK GLUCOSE ONCE DAILY AS DIRECTED 10/18/19   Mikey Kirschner, MD      Allergies    Patient has no known allergies.    Review of Systems   Review of Systems  Eyes:  Positive for visual disturbance.  Respiratory:  Negative for shortness of breath.   Cardiovascular:  Negative for chest pain.  Neurological:  Positive for weakness, numbness and headaches.    Physical Exam Updated Vital Signs BP (!) 179/95 (BP Location: Left Arm)   Pulse 70   Temp 98.4 F (36.9 C) (Oral)   Resp 18   Ht 5\' 2"  (1.575 m)   Wt 74.8 kg   LMP  (Within Weeks)   SpO2 99%   BMI 30.18 kg/m  Physical Exam Vitals and nursing note reviewed.  Constitutional:      Appearance: She is well-developed.  HENT:     Head: Normocephalic and atraumatic.  Eyes:     Extraocular Movements: Extraocular movements intact.     Pupils: Pupils are equal, round, and reactive to light.  Cardiovascular:     Rate and Rhythm: Normal rate and regular rhythm.     Pulses:  Radial pulses are 2+ on the left side.     Heart sounds: Normal heart sounds.  Pulmonary:     Effort: Pulmonary effort is normal.     Breath sounds: Normal breath sounds.  Abdominal:     Palpations: Abdomen is soft.     Tenderness: There is no abdominal tenderness.  Skin:    General: Skin is warm and dry.  Neurological:     Mental Status: She is alert.     Comments: CN 3-12 grossly intact. 5/5 strength in right upper, bilateral lower extremities. 4/5 strength in left upper extremity. Grossly normal sensation save for subjective decreased sensation throughout entire left arm. Normal finger to nose.      ED Results / Procedures / Treatments   Labs (all labs ordered are listed, but only abnormal results are displayed) Labs Reviewed  CBC - Abnormal; Notable for the following components:      Result Value   Hemoglobin  15.2 (*)    All other components within normal limits  COMPREHENSIVE METABOLIC PANEL - Abnormal; Notable for the following components:   Sodium 134 (*)    Glucose, Bld 107 (*)    All other components within normal limits  RAPID URINE DRUG SCREEN, HOSP PERFORMED - Abnormal; Notable for the following components:   Tetrahydrocannabinol POSITIVE (*)    All other components within normal limits  PROTIME-INR  APTT  DIFFERENTIAL  ETHANOL  POC URINE PREG, ED    EKG EKG Interpretation  Date/Time:  Saturday October 18 2022 17:09:23 EST Ventricular Rate:  76 PR Interval:  122 QRS Duration: 96 QT Interval:  427 QTC Calculation: 481 R Axis:   1 Text Interpretation: Sinus rhythm nonspecific T waves Baseline wander in lead(s) V3 No old tracing to compare Confirmed by Pricilla Loveless [phone]) on 10/18/2022 5:12:59 PM  Radiology CT HEAD WO CONTRAST  Result Date: 10/18/2022 CLINICAL DATA:  Neuro deficit. Stroke suspected. High blood pressure at home today. EXAM: CT HEAD WITHOUT CONTRAST TECHNIQUE: Contiguous axial images were obtained from the base of the skull through the vertex without intravenous contrast. RADIATION DOSE REDUCTION: This exam was performed according to the departmental dose-optimization program which includes automated exposure control, adjustment of the mA and/or kV according to patient size and/or use of iterative reconstruction technique. COMPARISON:  None Available. FINDINGS: Brain: No evidence of acute infarction, hemorrhage, hydrocephalus, extra-axial collection or mass lesion/mass effect. Vascular: No hyperdense vessel or unexpected calcification. Skull: Normal. Negative for fracture or focal lesion. Sinuses/Orbits: No acute finding. Other: None. IMPRESSION: No acute intracranial abnormalities. Electronically Signed   By: Gerome Sam III M.D.   On: 10/18/2022 17:43    Procedures Procedures    Medications Ordered in ED Medications  metoCLOPramide (REGLAN) injection 10  mg (10 mg Intravenous Given 10/18/22 1921)  diphenhydrAMINE (BENADRYL) injection 25 mg (25 mg Intravenous Given 10/18/22 1920)  ketorolac (TORADOL) 15 MG/ML injection 15 mg (15 mg Intravenous Given 10/18/22 1920)  sodium chloride 0.9 % bolus 1,000 mL (0 mLs Intravenous Stopped 10/18/22 2049)  lisinopril (ZESTRIL) tablet 10 mg (10 mg Oral Given 10/18/22 2057)    ED Course/ Medical Decision Making/ A&P Clinical Course as of 10/18/22 2339  Sat Oct 18, 2022  1911 I discussed with Dr. Amada Jupiter. Given the atypical presentation, probably more like complicated migraine rather than CVA. Does have risk factors with DM, HTN. Recommends treatment of headache. If this resolves the arm symptoms, can call complicated migraine and hold off on transfer and MRI. Patient states  at this time both the pressure and arm symptoms are improving but not gone. [SG]    Clinical Course User Index [SG] Sherwood Gambler, MD                             Medical Decision Making Amount and/or Complexity of Data Reviewed Labs: ordered.    Details: No significant electrolyte disturbance or anemia. Radiology: ordered and independent interpretation performed.    Details: No head bleed on CT. ECG/medicine tests: independent interpretation performed.    Details: Nonspecific T waves.  Risk Prescription drug management.   Patient's presentation is overall unremarkable besides some mild weakness that is a little inconsistent.  Either way, it is only localized to her left arm.  No chest symptoms.  After initial workup, discussed with Dr. Leonel Ramsay and as above we have given her headache medicine and now all of her symptoms are resolved.  On repeat exam her neuro testing is normal.  Discussed we could still consider MRI given her history of diabetes but she prefers to go home.  Will have her follow-up with her PCP closely for the hypertension and consideration of her neurosymptoms and possible outpatient neuro referral.  However I  do think stroke is less likely and so we will start on anticoagulation.  With her diabetes we will start on lisinopril for blood pressure control and she was given her first dose here.  She was given return precautions and appears stable for discharge.        Final Clinical Impression(s) / ED Diagnoses Final diagnoses:  Hypertensive urgency    Rx / DC Orders ED Discharge Orders          Ordered    lisinopril (ZESTRIL) 10 MG tablet  Daily        10/18/22 2044              Sherwood Gambler, MD 10/18/22 2340

## 2022-10-18 NOTE — ED Triage Notes (Addendum)
Pt c/o higher BP than normal today. BP 180/135 at home. Pt had her check up for her diabetes this past week and was told that her BP was high and she needed to start taking it at home but not high enough to start her on medication yet. Pt reports feeling very tired after going to the gym this morning, which is unusual. Pt also c/o feeling like her head is going to "explode". Ibuprofen taken at 1200 today, with slight relief. Vision is more blurry than normal and has had to wear her glasses more than normal today. Pt also c/o left arm tingling and weakness since she woke up this morning around 0830-0900. LKW last night 10/17/22 around 2330.

## 2022-10-18 NOTE — Discharge Instructions (Signed)
Blood pressure is quite elevated today, you are being started on lisinopril to help control this.  You were given your first dose today, you can start your next dose tomorrow.  Otherwise, call your primary care doctor on Monday, 1/29.  If you develop new or worsening headache, weakness or numbness in your arms or legs, vision changes, chest pain, or any other new/concerning symptoms then return to the ER for evaluation or call 911.

## 2022-10-20 ENCOUNTER — Telehealth: Payer: Self-pay

## 2022-10-20 NOTE — Telephone Encounter (Signed)
Transition Care Management Unsuccessful Follow-up Telephone Call  Date of discharge and from where:  [facility] 10/18/2022  Attempts:  1st Attempt  Reason for unsuccessful TCM follow-up call:  Unable to reach patient [name], [name] Direct Dial [phone]

## 2022-10-20 NOTE — Addendum Note (Signed)
Addended by: Dairl Ponder on: 10/20/2022 10:03 AM   Modules accepted: Orders

## 2022-10-21 ENCOUNTER — Ambulatory Visit: Payer: 59 | Admitting: Family Medicine

## 2022-10-21 VITALS — BP 151/83 | Ht 62.0 in | Wt 166.0 lb

## 2022-10-21 DIAGNOSIS — I10 Essential (primary) hypertension: Secondary | ICD-10-CM

## 2022-10-21 MED ORDER — LISINOPRIL 20 MG PO TABS: 20.0000 mg | ORAL_TABLET | Freq: Every day | ORAL | 3 refills | Status: DC

## 2022-10-21 NOTE — Telephone Encounter (Signed)
Transition Care Management Unsuccessful Follow-up Telephone Call  Date of discharge and from where:  [facility] 10/18/2022  Attempts:  2nd Attempt  Reason for unsuccessful TCM follow-up call:  Unable to reach patient Juanda Crumble, [place] Direct Dial [phone]

## 2022-10-21 NOTE — Progress Notes (Signed)
Subjective:  Patient ID: [name], female    DOB: 1975/10/26  Age: 47 y.o. MRN: [id number]  CC: Chief Complaint  Patient presents with   ER follow up    HTN ER started patient on BP med- doinf well wit medication    HPI:  47 year old female with hypertension, hyperlipidemia, type 2 diabetes presents for follow-up.  At her last visit here, her blood pressure was significantly elevated.  She was advised to check her blood pressures at home.  Patient had significant headache and was evaluated in the ER on 1/27.  Had hypertensive urgency at that time.  Blood pressure was markedly elevated.  Patient had a thorough workup and was discharged home on lisinopril 10 mg daily.  Patient's blood pressure is improved but remains elevated.  She is feeling well.  She is watching her diet.  Trying to watch her sodium intake.  She endorses compliance with lisinopril.  Patient Active Problem List   Diagnosis Date Noted   Essential hypertension 10/21/2022   Tobacco abuse 04/07/2022   Hyperlipidemia associated with type 2 diabetes mellitus ([place]) 09/06/2020   Type 2 diabetes mellitus without complication, without long-term current use of insulin ([place]) 12/15/2017   Vitamin D deficiency 12/10/2017    Social Hx   Social History   Socioeconomic History   Marital status: Married    Spouse name: Not on file   Number of children: Not on file   Years of education: Not on file   Highest education level: Not on file  Occupational History   Not on file  Tobacco Use   Smoking status: Every Day    Packs/day: 0.50    Types: Cigarettes   Smokeless tobacco: Never  Vaping Use   Vaping Use: Never used  Substance and Sexual Activity   Alcohol use: No   Drug use: No   Sexual activity: Yes    Birth control/protection: Other-see comments    Comment: partner vasectomy  Other Topics Concern   Not on file  Social History Narrative   Not on file   Social Determinants of Health   Financial  Resource Strain: Not on file  Food Insecurity: Not on file  Transportation Needs: Not on file  Physical Activity: Not on file  Stress: Not on file  Social Connections: Not on file    Review of Systems Per HPI  Objective:  BP (!) 151/83   Ht 5\' 2"  (1.575 m)   Wt 166 lb (75.3 kg)   BMI 30.36 kg/m      10/21/2022   11:07 AM 10/21/2022   11:03 AM 10/18/2022    8:55 PM  BP/Weight  Systolic BP 175 102 585  Diastolic BP 83 277 95  Wt. (Lbs)  166   BMI  30.36 kg/m2     Physical Exam Vitals and nursing note reviewed.  Constitutional:      General: She is not in acute distress.    Appearance: Normal appearance.  HENT:     Head: Normocephalic and atraumatic.  Eyes:     General:        Right eye: No discharge.        Left eye: No discharge.     Conjunctiva/sclera: Conjunctivae normal.  Cardiovascular:     Rate and Rhythm: Normal rate and regular rhythm.  Pulmonary:     Effort: Pulmonary effort is normal.     Breath sounds: Normal breath sounds. No wheezing, rhonchi or rales.  Neurological:  Mental Status: She is alert.  Psychiatric:        Mood and Affect: Mood normal.        Behavior: Behavior normal.     Lab Results  Component Value Date   WBC 6.7 10/18/2022   HGB 15.2 (H) 10/18/2022   HCT 44.1 10/18/2022   PLT 187 10/18/2022   GLUCOSE 107 (H) 10/18/2022   CHOL 146 10/15/2022   TRIG 104 10/15/2022   HDL 49 10/15/2022   LDLCALC 78 10/15/2022   ALT 41 10/18/2022   AST 40 10/18/2022   NA 134 (L) 10/18/2022   K 3.5 10/18/2022   CL 101 10/18/2022   CREATININE 0.69 10/18/2022   BUN 14 10/18/2022   CO2 25 10/18/2022   TSH 1.390 02/17/2020   INR 1.0 10/18/2022   HGBA1C 5.9 (H) 10/15/2022     Assessment & Plan:   Problem List Items Addressed This Visit       Cardiovascular and Mediastinum   Essential hypertension - Primary    Remains uncontrolled/not at goal.  Increasing lisinopril to 20 mg daily.  Metabolic panel in 7 to 10 days.      Relevant  Medications   lisinopril (ZESTRIL) 20 MG tablet   Other Relevant Orders   CMP14+EGFR    Meds ordered this encounter  Medications   lisinopril (ZESTRIL) 20 MG tablet    Sig: Take 1 tablet (20 mg total) by mouth daily.    Dispense:  90 tablet    Refill:  3    Follow-up:  Return in about 1 month (around 11/20/2022).  [place]

## 2022-10-21 NOTE — Assessment & Plan Note (Signed)
Remains uncontrolled/not at goal.  Increasing lisinopril to 20 mg daily.  Metabolic panel in 7 to 10 days.

## 2022-10-21 NOTE — Patient Instructions (Signed)
Increase the lisinopril to 20 mg daily.  Lab in 7-10 days.  Follow up in 1 month.

## 2022-10-28 ENCOUNTER — Other Ambulatory Visit: Payer: Self-pay | Admitting: Family Medicine

## 2022-10-28 DIAGNOSIS — T7840XA Allergy, unspecified, initial encounter: Secondary | ICD-10-CM

## 2022-11-01 LAB — CMP14+EGFR
ALT: 32 IU/L (ref 0–32)
AST: 32 IU/L (ref 0–40)
Albumin/Globulin Ratio: 1.8 (ref 1.2–2.2)
Albumin: 4.7 g/dL (ref 3.9–4.9)
Alkaline Phosphatase: 53 IU/L (ref 44–121)
BUN/Creatinine Ratio: 21 (ref 9–23)
BUN: 16 mg/dL (ref 6–24)
Bilirubin Total: 0.5 mg/dL (ref 0.0–1.2)
CO2: 22 mmol/L (ref 20–29)
Calcium: 9.6 mg/dL (ref 8.7–10.2)
Chloride: 103 mmol/L (ref 96–106)
Creatinine, Ser: 0.76 mg/dL (ref 0.57–1.00)
Globulin, Total: 2.6 g/dL (ref 1.5–4.5)
Glucose: 117 mg/dL — ABNORMAL HIGH (ref 70–99)
Potassium: 4.2 mmol/L (ref 3.5–5.2)
Sodium: 140 mmol/L (ref 134–144)
Total Protein: 7.3 g/dL (ref 6.0–8.5)
eGFR: 99 mL/min/{1.73_m2} (ref >59)

## 2022-11-03 ENCOUNTER — Other Ambulatory Visit: Payer: Self-pay | Admitting: Family Medicine

## 2022-11-03 ENCOUNTER — Other Ambulatory Visit: Payer: Self-pay | Admitting: *Deleted

## 2022-11-03 DIAGNOSIS — E1169 Type 2 diabetes mellitus with other specified complication: Secondary | ICD-10-CM

## 2022-11-03 MED ORDER — ACCU-CHEK GUIDE VI STRP: ORAL_STRIP | 5 refills | Status: AC

## 2022-11-24 ENCOUNTER — Encounter: Payer: Self-pay | Admitting: Family Medicine

## 2022-11-24 ENCOUNTER — Ambulatory Visit: Payer: 59 | Admitting: Family Medicine

## 2022-11-24 VITALS — BP 140/90 | HR 88 | Temp 97.9°F | Ht 62.0 in | Wt 163.0 lb

## 2022-11-24 DIAGNOSIS — I10 Essential (primary) hypertension: Secondary | ICD-10-CM | POA: Diagnosis not present

## 2022-11-24 MED ORDER — AMLODIPINE BESYLATE 5 MG PO TABS: 5.0000 mg | ORAL_TABLET | Freq: Every day | ORAL | 3 refills | Status: DC

## 2022-11-24 NOTE — Progress Notes (Signed)
Subjective:  Patient ID: [name], female    DOB: 11-05-1975  Age: 47 y.o. MRN: [id number]  CC: Chief Complaint  Patient presents with   Hypertension    1 month follow up    HPI:  47 year old female presents for follow-up regarding hypertension.  Patient's lisinopril recently increased.  Follow-up metabolic panel with normal potassium and creatinine.  Patient states that her blood pressure has improved.  BP 140/90 today.  She has made some dietary changes and is trying to stay active.  No chest pain.  No shortness of breath.  Patient Active Problem List   Diagnosis Date Noted   Essential hypertension 10/21/2022   Tobacco abuse 04/07/2022   Hyperlipidemia associated with type 2 diabetes mellitus ([place]) 09/06/2020   Type 2 diabetes mellitus without complication, without long-term current use of insulin ([place]) 12/15/2017   Vitamin D deficiency 12/10/2017    Social Hx   Social History   Socioeconomic History   Marital status: Married    Spouse name: Not on file   Number of children: Not on file   Years of education: Not on file   Highest education level: Not on file  Occupational History   Not on file  Tobacco Use   Smoking status: Every Day    Packs/day: 0.50    Types: Cigarettes   Smokeless tobacco: Never  Vaping Use   Vaping Use: Never used  Substance and Sexual Activity   Alcohol use: No   Drug use: No   Sexual activity: Yes    Birth control/protection: Other-see comments    Comment: partner vasectomy  Other Topics Concern   Not on file  Social History Narrative   Not on file   Social Determinants of Health   Financial Resource Strain: Not on file  Food Insecurity: Not on file  Transportation Needs: Not on file  Physical Activity: Not on file  Stress: Not on file  Social Connections: Not on file    Review of Systems Per HPI  Objective:  BP (!) 140/90   Pulse 88   Temp 97.9 F (36.6 C)   Ht '5\' 2"'$  (1.575 m)   Wt 163 lb (73.9 kg)   LMP  11/01/2022 (Approximate)   SpO2 98%   BMI 29.81 kg/m      11/24/2022   10:42 AM 10/21/2022   11:07 AM 10/21/2022   11:03 AM  BP/Weight  Systolic BP [id number]  Diastolic BP 90 83 123456  Wt. (Lbs) 163  166  BMI 29.81 kg/m2  30.36 kg/m2    Physical Exam Vitals and nursing note reviewed.  Constitutional:      Appearance: Normal appearance.  HENT:     Head: Normocephalic and atraumatic.  Cardiovascular:     Rate and Rhythm: Normal rate and regular rhythm.  Pulmonary:     Effort: Pulmonary effort is normal.     Breath sounds: Normal breath sounds. No wheezing, rhonchi or rales.  Neurological:     Mental Status: She is alert.  Psychiatric:        Mood and Affect: Mood normal.        Behavior: Behavior normal.     Lab Results  Component Value Date   WBC 6.7 10/18/2022   HGB 15.2 (H) 10/18/2022   HCT 44.1 10/18/2022   PLT 187 10/18/2022   GLUCOSE 117 (H) 10/31/2022   CHOL 146 10/15/2022   TRIG 104 10/15/2022   HDL 49 10/15/2022   LDLCALC 78 10/15/2022  ALT 32 10/31/2022   AST 32 10/31/2022   NA 140 10/31/2022   K 4.2 10/31/2022   CL 103 10/31/2022   CREATININE 0.76 10/31/2022   BUN 16 10/31/2022   CO2 22 10/31/2022   TSH 1.390 02/17/2020   INR 1.0 10/18/2022   HGBA1C 5.9 (H) 10/15/2022     Assessment & Plan:   Problem List Items Addressed This Visit       Cardiovascular and Mediastinum   Essential hypertension - Primary    Improved but would still like better control.  Continue lisinopril.  Adding amlodipine.      Relevant Medications   amLODipine (NORVASC) 5 MG tablet    Meds ordered this encounter  Medications   amLODipine (NORVASC) 5 MG tablet    Sig: Take 1 tablet (5 mg total) by mouth daily.    Dispense:  90 tablet    Refill:  3    Follow-up:  Return in about 6 months (around 05/27/2023).  [place]

## 2022-11-24 NOTE — Assessment & Plan Note (Signed)
Improved but would still like better control.  Continue lisinopril.  Adding amlodipine.

## 2022-11-24 NOTE — Patient Instructions (Signed)
Continue the lisinopril.  I have added Amlodipine.  Follow up with me in 6 months.

## 2023-01-16 ENCOUNTER — Ambulatory Visit (INDEPENDENT_AMBULATORY_CARE_PROVIDER_SITE_OTHER): Payer: 59 | Admitting: Nurse Practitioner

## 2023-01-16 VITALS — BP 117/80 | Ht 62.0 in | Wt 166.0 lb

## 2023-01-16 DIAGNOSIS — Z01419 Encounter for gynecological examination (general) (routine) without abnormal findings: Secondary | ICD-10-CM

## 2023-01-16 DIAGNOSIS — Z124 Encounter for screening for malignant neoplasm of cervix: Secondary | ICD-10-CM

## 2023-01-16 DIAGNOSIS — Z1231 Encounter for screening mammogram for malignant neoplasm of breast: Secondary | ICD-10-CM

## 2023-01-16 DIAGNOSIS — Z1151 Encounter for screening for human papillomavirus (HPV): Secondary | ICD-10-CM

## 2023-01-16 NOTE — Progress Notes (Unsigned)
   Subjective:    Patient ID: [name], female    DOB: [birth year], 47 y.o.   MRN: [id number]  HPI  The patient comes in today for a wellness visit.    A review of their health history was completed.  A review of medications was also completed.  Any needed refills; no  Eating habits: good  Falls/  MVA accidents in past few months: none  Regular exercise: yes- gym  Specialist pt sees on regular basis: no  Preventative health issues were discussed.   Additional concerns: no   Review of Systems     Objective:   Physical Exam        Assessment & Plan:

## 2023-01-16 NOTE — Progress Notes (Unsigned)
Subjective:    Patient ID: [name], female    DOB: [birth year], 47 y.o.   MRN: [id number]  HPI  The patient comes in today for a wellness visit.  A review of their health history was completed.  A review of medications was also completed.  Any needed refills; no  Eating habits: healthy diet with variety of foods  Falls/  MVA accidents in past few months: none  Regular exercise: yes, cardio and weight lifting 3 times per week  Specialist pt sees on regular basis: no  Preventative health issues were discussed.   Additional concerns: no   Patient does get regular eye and dental exams. She is due for a mammogram, and we will schedule that for her today. She is also due for a colonoscopy. No family history of colon cancer. She is due for her pap smear as well. Gets regular dental exams. Menstrual cycles are regular, lasting from 4-5 days. Two of those days being heavier flow. She is sexually active with one female partner. Her husband has had a vasectomy. She does not use birth control. Patient does smoke 1/2 pack of cigarettes per day. States that she is going through a lot of stress dealing with her child moving off to college, and she is not willing to quit smoking yet. She does not drink alcohol.   Review of Systems  Constitutional:  Negative for fatigue and fever.  HENT:  Negative for sore throat and trouble swallowing.   Respiratory:  Negative for cough, chest tightness, shortness of breath and wheezing.   Cardiovascular:  Negative for chest pain and palpitations.  Gastrointestinal:  Negative for constipation, diarrhea, nausea and vomiting.  Genitourinary:  Negative for difficulty urinating, menstrual problem and pelvic pain.  Psychiatric/Behavioral:  Negative for sleep disturbance.       Objective:   Physical Exam Vitals and nursing note reviewed. Exam conducted with a chaperone present.  Constitutional:      General: She is not in acute distress. Neck:      Thyroid: No thyroid mass or thyromegaly.  Cardiovascular:     Rate and Rhythm: Normal rate and regular rhythm.     Pulses: Normal pulses.     Heart sounds: Normal heart sounds, S1 normal and S2 normal. No murmur heard. Pulmonary:     Effort: Pulmonary effort is normal. No respiratory distress.     Breath sounds: No wheezing.  Chest:  Breasts:    Right: Normal. No inverted nipple, mass or skin change.     Left: Normal. No inverted nipple, mass or skin change.  Abdominal:     General: There is no distension.     Palpations: Abdomen is soft. There is no mass.     Tenderness: There is no abdominal tenderness.  Genitourinary:    Comments: Skin warm and dry. Pelvic: external genitalia is normal in appearance no lesions, vagina: no discharge, urethra has no lesions or masses noted, Cervix: smooth and bulbous, negative for cervical motion tenderness. Uterus: No tenderness or obvious masses felt. Suprapubic region non-tender  Lymphadenopathy:     Cervical: No cervical adenopathy.     Upper Body:     Right upper body: No supraclavicular, axillary or pectoral adenopathy.     Left upper body: No supraclavicular, axillary or pectoral adenopathy.  Skin:    General: Skin is warm and dry.  Neurological:     Mental Status: She is alert.  Psychiatric:        Mood and  Affect: Mood normal.        Behavior: Behavior normal.        Thought Content: Thought content normal.        Judgment: Judgment normal.   Vitals:   01/16/23 0829  BP: 117/80  Height: 5\' 2"  (1.575 m)  Weight: 166 lb (75.3 kg)  BMI (Calculated): 30.35       01/16/2023    8:37 AM 11/24/2022   10:47 AM 10/21/2022   11:03 AM 10/09/2022    1:14 PM 04/22/2021    2:08 PM  Depression screen PHQ 2/9  Decreased Interest 0 0 0 0 0  Down, Depressed, Hopeless 0 0 0 0 0  PHQ - 2 Score 0 0 0 0 0  Altered sleeping 0 0 0 0   Tired, decreased energy 0 0 0 0   Change in appetite 0 0 0 0   Feeling bad or failure about yourself  0 0 0 0    Trouble concentrating 0 0 0 0   Moving slowly or fidgety/restless 0 0 0 0   Suicidal thoughts 0 0 0 0   PHQ-9 Score 0 0 0 0   Difficult doing work/chores  Not difficult at all Not difficult at all Not difficult at all        11/24/2022   10:47 AM 10/21/2022   11:04 AM 10/09/2022    1:14 PM  GAD 7 : Generalized Anxiety Score  Nervous, Anxious, on Edge 0 0 0  Control/stop worrying 0 0 0  Worry too much - different things 0 0 0  Trouble relaxing 0 0 0  Restless 0 0 0  Easily annoyed or irritable 0 0 0  Afraid - awful might happen 0 0 0  Total GAD 7 Score 0 0 0  Anxiety Difficulty Not difficult at all Not difficult at all Not difficult at all      Assessment & Plan:  1. Screening mammogram, encounter for - MM Digital Screening  2. Well woman exam - IGP, Aptima HPV -Educated patient to continue to exercise as tolerated, and eating a healthy diet.  -Educated patient about smoking cessation.  -Patient defers colonoscopy at this time.   3. Screening for HPV (human papillomavirus) - IGP, Aptima HPV  4. Screening for cervical cancer - IGP, Aptima HPV   Return in about 1 year (around 01/16/2024) for physical.

## 2023-01-17 ENCOUNTER — Encounter: Payer: Self-pay | Admitting: Nurse Practitioner

## 2023-01-23 LAB — IGP, APTIMA HPV: HPV Aptima: NEGATIVE

## 2023-01-23 LAB — SPECIMEN STATUS REPORT

## 2023-01-26 ENCOUNTER — Ambulatory Visit (HOSPITAL_COMMUNITY)
Admission: RE | Admit: 2023-01-26 | Discharge: 2023-01-26 | Disposition: A | Discharge status: Home / Self Care | Payer: 59 | Source: Ambulatory Visit | Attending: Family Medicine | Admitting: Family Medicine

## 2023-01-26 DIAGNOSIS — Z1231 Encounter for screening mammogram for malignant neoplasm of breast: Secondary | ICD-10-CM | POA: Insufficient documentation

## 2023-02-03 ENCOUNTER — Other Ambulatory Visit: Payer: Self-pay | Admitting: Family Medicine

## 2023-02-03 DIAGNOSIS — T7840XA Allergy, unspecified, initial encounter: Secondary | ICD-10-CM

## 2023-02-10 ENCOUNTER — Other Ambulatory Visit: Payer: Self-pay | Admitting: *Deleted

## 2023-02-10 DIAGNOSIS — E1169 Type 2 diabetes mellitus with other specified complication: Secondary | ICD-10-CM

## 2023-02-10 MED ORDER — ROSUVASTATIN CALCIUM 5 MG PO TABS
5.0000 mg | ORAL_TABLET | Freq: Every day | ORAL | 0 refills | Status: DC
Start: 2023-02-10 — End: 2023-12-10

## 2023-06-01 ENCOUNTER — Encounter: Payer: Self-pay | Admitting: Family Medicine

## 2023-06-01 ENCOUNTER — Ambulatory Visit: Payer: 59 | Admitting: Family Medicine

## 2023-06-01 VITALS — BP 147/90 | HR 79 | Temp 98.6°F | Wt 169.2 lb

## 2023-06-01 DIAGNOSIS — I10 Essential (primary) hypertension: Secondary | ICD-10-CM

## 2023-06-01 DIAGNOSIS — Z720 Tobacco use: Secondary | ICD-10-CM

## 2023-06-01 DIAGNOSIS — D582 Other hemoglobinopathies: Secondary | ICD-10-CM | POA: Diagnosis not present

## 2023-06-01 DIAGNOSIS — E1169 Type 2 diabetes mellitus with other specified complication: Secondary | ICD-10-CM

## 2023-06-01 DIAGNOSIS — Z7984 Long term (current) use of oral hypoglycemic drugs: Secondary | ICD-10-CM | POA: Diagnosis not present

## 2023-06-01 DIAGNOSIS — E119 Type 2 diabetes mellitus without complications: Secondary | ICD-10-CM

## 2023-06-01 DIAGNOSIS — E785 Hyperlipidemia, unspecified: Secondary | ICD-10-CM

## 2023-06-01 NOTE — Patient Instructions (Signed)
Labs ordered.  Follow up in 6 months.  Take care  Dr. Cook  

## 2023-06-02 MED ORDER — AMLODIPINE BESYLATE 5 MG PO TABS: 5.0000 mg | ORAL_TABLET | Freq: Every day | ORAL | 3 refills | Status: DC

## 2023-06-02 MED ORDER — LISINOPRIL 20 MG PO TABS: 20.0000 mg | ORAL_TABLET | Freq: Every day | ORAL | 3 refills | Status: DC

## 2023-06-02 NOTE — Progress Notes (Signed)
Subjective:  Patient ID: [name], female    DOB: Oct 09, 1975  Age: 47 y.o. MRN: [id number]  CC: Follow up   HPI:  47 year old female with hypertension, type 2 diabetes, hyperlipidemia, tobacco abuse presents for follow-up.  Patient states that she has recently been sick but is on the mend.  Declines flu shot today.  Last A1c was in January and was 5.9.  Patient needs labs including A1c today.  She is compliant with metformin.  Patient reports that her blood pressure readings have been well-controlled at home.  Last time she was in the office her blood pressure was well-controlled as well.  Patient believes that her blood pressure is elevated here today due to use of over-the-counter treatment for recent respiratory infection.  She is compliant with lisinopril and amlodipine.  Lipids have been fairly well-controlled.  Last LDL 78.  Would prefer LDL under 70.  She is compliant with Crestor.  Needs lipid panel.  Patient continues to smoke.  She is not interested in smoking cessation at this time.  Patient Active Problem List   Diagnosis Date Noted   Essential hypertension 10/21/2022   Tobacco abuse 04/07/2022   Hyperlipidemia associated with type 2 diabetes mellitus ([facility]) 09/06/2020   Type 2 diabetes mellitus without complication, without long-term current use of insulin ([facility]) 12/15/2017   Vitamin D deficiency 12/10/2017    Social Hx   Social History   Socioeconomic History   Marital status: Married    Spouse name: Not on file   Number of children: Not on file   Years of education: Not on file   Highest education level: Not on file  Occupational History   Not on file  Tobacco Use   Smoking status: Every Day    Current packs/day: 0.50    Types: Cigarettes   Smokeless tobacco: Never  Vaping Use   Vaping status: Never Used  Substance and Sexual Activity   Alcohol use: No   Drug use: No   Sexual activity: Yes    Birth control/protection: Other-see comments     Comment: partner vasectomy  Other Topics Concern   Not on file  Social History Narrative   Not on file   Social Determinants of Health   Financial Resource Strain: Not on file  Food Insecurity: Not on file  Transportation Needs: Not on file  Physical Activity: Not on file  Stress: Not on file  Social Connections: Not on file    Review of Systems  Constitutional:  Negative for fever.  Respiratory:  Negative for shortness of breath.   Cardiovascular: Negative.    Objective:  BP (!) 147/90   Pulse 79   Temp 98.6 F (37 C) (Oral)   Wt 169 lb 3.2 oz (76.7 kg)   SpO2 94%   BMI 30.95 kg/m      06/01/2023    1:08 PM 01/16/2023    8:29 AM 11/24/2022   10:42 AM  BP/Weight  Systolic BP 147 117 140  Diastolic BP 90 80 90  Wt. (Lbs) 169.2 166 163  BMI 30.95 kg/m2 30.36 kg/m2 29.81 kg/m2    Physical Exam Vitals reviewed.  Constitutional:      General: She is not in acute distress.    Appearance: Normal appearance.  HENT:     Head: Normocephalic and atraumatic.  Cardiovascular:     Rate and Rhythm: Normal rate and regular rhythm.  Pulmonary:     Effort: Pulmonary effort is normal.  Breath sounds: Normal breath sounds. No wheezing or rales.  Neurological:     Mental Status: She is alert.  Psychiatric:        Mood and Affect: Mood normal.        Behavior: Behavior normal.     Lab Results  Component Value Date   WBC 6.7 10/18/2022   HGB 15.2 (H) 10/18/2022   HCT 44.1 10/18/2022   PLT 187 10/18/2022   GLUCOSE 117 (H) 10/31/2022   CHOL 146 10/15/2022   TRIG 104 10/15/2022   HDL 49 10/15/2022   LDLCALC 78 10/15/2022   ALT 32 10/31/2022   AST 32 10/31/2022   NA 140 10/31/2022   K 4.2 10/31/2022   CL 103 10/31/2022   CREATININE 0.76 10/31/2022   BUN 16 10/31/2022   CO2 22 10/31/2022   TSH 1.390 02/17/2020   INR 1.0 10/18/2022   HGBA1C 5.9 (H) 10/15/2022     Assessment & Plan:   Problem List Items Addressed This Visit       Cardiovascular and  Mediastinum   Essential hypertension - Primary    Stable Home readings.  Continue lisinopril and amlodipine.      Relevant Medications   lisinopril (ZESTRIL) 20 MG tablet   amLODipine (NORVASC) 5 MG tablet     Endocrine   Type 2 diabetes mellitus without complication, without long-term current use of insulin ([facility])    Stable.  A1c to assess.  Continue metformin.      Relevant Medications   lisinopril (ZESTRIL) 20 MG tablet   Other Relevant Orders   CMP14+EGFR   Hemoglobin A1c   Microalbumin / creatinine urine ratio   Hyperlipidemia associated with type 2 diabetes mellitus ([facility])    For LDL under 70.  Reassessing today.  Continue Crestor.  May need dose increase.      Relevant Medications   lisinopril (ZESTRIL) 20 MG tablet   amLODipine (NORVASC) 5 MG tablet   Other Relevant Orders   Lipid panel     Other   Tobacco abuse    Patient not interested in cessation at this time.      Other Visit Diagnoses     Elevated hemoglobin ([facility])       Relevant Orders   CBC       Meds ordered this encounter  Medications   lisinopril (ZESTRIL) 20 MG tablet    Sig: Take 1 tablet (20 mg total) by mouth daily.    Dispense:  90 tablet    Refill:  3   amLODipine (NORVASC) 5 MG tablet    Sig: Take 1 tablet (5 mg total) by mouth daily.    Dispense:  90 tablet    Refill:  3    Follow-up:  6 months  Shahad Mazurek Adriana Simas DO [facility]

## 2023-06-02 NOTE — Assessment & Plan Note (Signed)
Patient not interested in cessation at this time

## 2023-06-02 NOTE — Assessment & Plan Note (Signed)
Stable Home readings.  Continue lisinopril and amlodipine.

## 2023-06-02 NOTE — Assessment & Plan Note (Signed)
For LDL under 70.  Reassessing today.  Continue Crestor.  May need dose increase.

## 2023-06-02 NOTE — Assessment & Plan Note (Signed)
Stable.  A1c to assess.  Continue metformin.

## 2023-06-09 ENCOUNTER — Encounter: Payer: Self-pay | Admitting: Family Medicine

## 2023-06-23 LAB — HM DIABETES EYE EXAM

## 2023-08-14 ENCOUNTER — Other Ambulatory Visit: Payer: Self-pay | Admitting: Family Medicine

## 2023-08-14 DIAGNOSIS — T7840XA Allergy, unspecified, initial encounter: Secondary | ICD-10-CM

## 2023-11-16 ENCOUNTER — Other Ambulatory Visit: Payer: Self-pay | Admitting: Family Medicine

## 2023-11-16 DIAGNOSIS — E119 Type 2 diabetes mellitus without complications: Secondary | ICD-10-CM

## 2023-11-16 DIAGNOSIS — T7840XA Allergy, unspecified, initial encounter: Secondary | ICD-10-CM

## 2023-11-30 ENCOUNTER — Ambulatory Visit: Payer: 59 | Admitting: Family Medicine

## 2023-11-30 VITALS — BP 122/84 | Ht 62.0 in | Wt 170.6 lb

## 2023-11-30 DIAGNOSIS — I10 Essential (primary) hypertension: Secondary | ICD-10-CM

## 2023-11-30 DIAGNOSIS — E1169 Type 2 diabetes mellitus with other specified complication: Secondary | ICD-10-CM

## 2023-11-30 DIAGNOSIS — Z7984 Long term (current) use of oral hypoglycemic drugs: Secondary | ICD-10-CM | POA: Diagnosis not present

## 2023-11-30 DIAGNOSIS — D582 Other hemoglobinopathies: Secondary | ICD-10-CM | POA: Diagnosis not present

## 2023-11-30 DIAGNOSIS — E119 Type 2 diabetes mellitus without complications: Secondary | ICD-10-CM

## 2023-11-30 DIAGNOSIS — Z720 Tobacco use: Secondary | ICD-10-CM

## 2023-11-30 DIAGNOSIS — E785 Hyperlipidemia, unspecified: Secondary | ICD-10-CM

## 2023-11-30 NOTE — Patient Instructions (Signed)
 Labs at your convenience.  Follow up in 6 months.

## 2023-12-01 MED ORDER — LISINOPRIL 20 MG PO TABS: 20.0000 mg | ORAL_TABLET | Freq: Every day | ORAL | 3 refills | Status: DC

## 2023-12-01 MED ORDER — AMLODIPINE BESYLATE 5 MG PO TABS: 5.0000 mg | ORAL_TABLET | Freq: Every day | ORAL | 3 refills | Status: DC

## 2023-12-01 NOTE — Assessment & Plan Note (Signed)
 Is not ready to quit at this time.

## 2023-12-01 NOTE — Assessment & Plan Note (Signed)
 Has been stable.  Continue metformin.  Labs today.  Foot exam performed today.

## 2023-12-01 NOTE — Assessment & Plan Note (Signed)
 Has been fairly well-controlled.  Awaiting lipid panel results.  Continue statin.  May need dose increase.

## 2023-12-01 NOTE — Progress Notes (Signed)
 Subjective:  Patient ID: [name], female    DOB: [birth year]  Age: 48 y.o. MRN: [id number]  CC:   Chief Complaint  Patient presents with   Hypertension    Follow up    HPI:  48 year old female presents for follow up.  Patient reports that overall she is doing well.  Patient continues to smoke.  She is not interested in cessation at this time.  She is a candidate for pneumococcal vaccination but declines today.  BP well-controlled on amlodipine and lisinopril.  Patient tolerating Crestor.  Patient needs labs today.  Diabetes has been stable on metformin.    Patient Active Problem List   Diagnosis Date Noted   Essential hypertension 10/21/2022   Tobacco abuse 04/07/2022   Hyperlipidemia associated with type 2 diabetes mellitus (HCC) 09/06/2020   Type 2 diabetes mellitus without complication, without long-term current use of insulin (HCC) 12/15/2017   Vitamin D deficiency 12/10/2017    Social Hx   Social History   Socioeconomic History   Marital status: Married    Spouse name: Not on file   Number of children: Not on file   Years of education: Not on file   Highest education level: Not on file  Occupational History   Not on file  Tobacco Use   Smoking status: Every Day    Current packs/day: 0.50    Types: Cigarettes   Smokeless tobacco: Never  Vaping Use   Vaping status: Never Used  Substance and Sexual Activity   Alcohol use: No   Drug use: No   Sexual activity: Yes    Birth control/protection: Other-see comments    Comment: partner vasectomy  Other Topics Concern   Not on file  Social History Narrative   ** Merged History Encounter **       Social Drivers of Corporate investment banker Strain: Not on file  Food Insecurity: Not on file  Transportation Needs: Not on file  Physical Activity: Not on file  Stress: Not on file  Social Connections: Not on file    Review of Systems  Constitutional: Negative.   Respiratory: Negative.     Cardiovascular: Negative.    Objective:  BP 122/84   Ht 5\' 2"  (1.575 m)   Wt 170 lb 9.6 oz (77.4 kg)   BMI 31.20 kg/m      11/30/2023    1:02 PM 06/01/2023    1:08 PM 01/16/2023    8:29 AM  BP/Weight  Systolic BP 122 147 117  Diastolic BP 84 90 80  Wt. (Lbs) 170.6 169.2 166  BMI 31.2 kg/m2 30.95 kg/m2 30.36 kg/m2    Physical Exam Vitals and nursing note reviewed.  Constitutional:      General: She is not in acute distress.    Appearance: Normal appearance.  HENT:     Head: Normocephalic and atraumatic.  Eyes:     General:        Right eye: No discharge.        Left eye: No discharge.     Conjunctiva/sclera: Conjunctivae normal.  Cardiovascular:     Rate and Rhythm: Normal rate and regular rhythm.  Pulmonary:     Effort: Pulmonary effort is normal.     Breath sounds: Normal breath sounds. No wheezing, rhonchi or rales.  Feet:     Comments: Diabetic foot exam performed today.  See quality metrics section. Neurological:     Mental Status: She is alert.  Psychiatric:  Mood and Affect: Mood normal.        Behavior: Behavior normal.     Lab Results  Component Value Date   WBC 6.7 10/18/2022   HGB 15.2 (H) 10/18/2022   HCT 44.1 10/18/2022   PLT 187 10/18/2022   GLUCOSE 117 (H) 10/31/2022   CHOL 146 10/15/2022   TRIG 104 10/15/2022   HDL 49 10/15/2022   LDLCALC 78 10/15/2022   ALT 32 10/31/2022   AST 32 10/31/2022   NA 140 10/31/2022   K 4.2 10/31/2022   CL 103 10/31/2022   CREATININE 0.76 10/31/2022   BUN 16 10/31/2022   CO2 22 10/31/2022   TSH 1.390 02/17/2020   INR 1.0 10/18/2022   HGBA1C 5.9 (H) 10/15/2022     Assessment & Plan:  Type 2 diabetes mellitus without complication, without long-term current use of insulin (HCC) Assessment & Plan: Has been stable.  Continue metformin.  Labs today.  Foot exam performed today.  Orders: -     CMP14+EGFR -     Hemoglobin A1c -     Microalbumin / creatinine urine ratio  Hyperlipidemia associated  with type 2 diabetes mellitus (HCC) Assessment & Plan: Has been fairly well-controlled.  Awaiting lipid panel results.  Continue statin.  May need dose increase.  Orders: -     Lipid panel  Elevated hemoglobin (HCC) -     CBC  Essential hypertension Assessment & Plan: Stable.  Continue current medications.  Labs today.  Medications were refilled.  Orders: -     amLODIPine Besylate; Take 1 tablet (5 mg total) by mouth daily.  Dispense: 90 tablet; Refill: 3 -     Lisinopril; Take 1 tablet (20 mg total) by mouth daily.  Dispense: 90 tablet; Refill: 3  Tobacco abuse Assessment & Plan: Is not ready to quit at this time.    Follow-up: 6 months  Orlanda Lemmerman Adriana Simas DO [facility]

## 2023-12-01 NOTE — Assessment & Plan Note (Signed)
 Stable.  Continue current medications.  Labs today.  Medications were refilled.

## 2023-12-04 LAB — MICROALBUMIN / CREATININE URINE RATIO
Creatinine, Urine: 249.9 mg/dL
Microalb/Creat Ratio: 5 mg/g{creat} (ref 0–29)
Microalbumin, Urine: 11.8 ug/mL

## 2023-12-04 LAB — CBC
Hematocrit: 46.1 % (ref 34.0–46.6)
Hemoglobin: 15.5 g/dL (ref 11.1–15.9)
MCH: 32 pg (ref 26.6–33.0)
MCHC: 33.6 g/dL (ref 31.5–35.7)
MCV: 95 fL (ref 79–97)
Platelets: 188 10*3/uL (ref 150–450)
RBC: 4.85 x10E6/uL (ref 3.77–5.28)
RDW: 12.3 % (ref 11.7–15.4)
WBC: 8.6 10*3/uL (ref 3.4–10.8)

## 2023-12-04 LAB — CMP14+EGFR
ALT: 51 IU/L — ABNORMAL HIGH (ref 0–32)
AST: 50 IU/L — ABNORMAL HIGH (ref 0–40)
Albumin: 4.3 g/dL (ref 3.9–4.9)
Alkaline Phosphatase: 69 IU/L (ref 44–121)
BUN/Creatinine Ratio: 17 (ref 9–23)
BUN: 12 mg/dL (ref 6–24)
Bilirubin Total: 0.4 mg/dL (ref 0.0–1.2)
CO2: 23 mmol/L (ref 20–29)
Calcium: 9.6 mg/dL (ref 8.7–10.2)
Chloride: 100 mmol/L (ref 96–106)
Creatinine, Ser: 0.7 mg/dL (ref 0.57–1.00)
Globulin, Total: 2.6 g/dL (ref 1.5–4.5)
Glucose: 220 mg/dL — ABNORMAL HIGH (ref 70–99)
Potassium: 4.1 mmol/L (ref 3.5–5.2)
Sodium: 139 mmol/L (ref 134–144)
Total Protein: 6.9 g/dL (ref 6.0–8.5)
eGFR: 109 mL/min/{1.73_m2} (ref >59)

## 2023-12-04 LAB — LIPID PANEL
Chol/HDL Ratio: 3.3 ratio (ref 0.0–4.4)
Cholesterol, Total: 143 mg/dL (ref 100–199)
HDL: 43 mg/dL (ref >39)
LDL Chol Calc (NIH): 81 mg/dL (ref 0–99)
Triglycerides: 103 mg/dL (ref 0–149)
VLDL Cholesterol Cal: 19 mg/dL (ref 5–40)

## 2023-12-04 LAB — HEMOGLOBIN A1C
Est. average glucose Bld gHb Est-mCnc: 169 mg/dL
Hgb A1c MFr Bld: 7.5 % — ABNORMAL HIGH (ref 4.8–5.6)

## 2023-12-10 ENCOUNTER — Other Ambulatory Visit: Payer: Self-pay | Admitting: Family Medicine

## 2023-12-10 DIAGNOSIS — E1169 Type 2 diabetes mellitus with other specified complication: Secondary | ICD-10-CM

## 2023-12-10 MED ORDER — ROSUVASTATIN CALCIUM 10 MG PO TABS: 10.0000 mg | ORAL_TABLET | Freq: Every day | ORAL | 3 refills | Status: DC

## 2023-12-21 LAB — HM DIABETES EYE EXAM

## 2024-02-17 ENCOUNTER — Other Ambulatory Visit: Payer: Self-pay | Admitting: Family Medicine

## 2024-02-17 DIAGNOSIS — E1169 Type 2 diabetes mellitus with other specified complication: Secondary | ICD-10-CM

## 2024-02-17 DIAGNOSIS — E119 Type 2 diabetes mellitus without complications: Secondary | ICD-10-CM

## 2024-02-17 DIAGNOSIS — T7840XA Allergy, unspecified, initial encounter: Secondary | ICD-10-CM

## 2024-02-18 ENCOUNTER — Other Ambulatory Visit: Payer: Self-pay

## 2024-02-18 DIAGNOSIS — E119 Type 2 diabetes mellitus without complications: Secondary | ICD-10-CM

## 2024-02-18 DIAGNOSIS — E785 Hyperlipidemia, unspecified: Secondary | ICD-10-CM

## 2024-02-18 DIAGNOSIS — T7840XA Allergy, unspecified, initial encounter: Secondary | ICD-10-CM

## 2024-02-18 MED ORDER — METFORMIN HCL 500 MG PO TABS: 500.0000 mg | ORAL_TABLET | Freq: Every day | ORAL | 3 refills | Status: DC

## 2024-02-18 MED ORDER — CETIRIZINE HCL 10 MG PO TABS
10.0000 mg | ORAL_TABLET | Freq: Every day | ORAL | 3 refills | Status: AC
Start: 2024-02-18 — End: ?

## 2024-02-18 MED ORDER — ROSUVASTATIN CALCIUM 10 MG PO TABS: 10.0000 mg | ORAL_TABLET | Freq: Every day | ORAL | 3 refills | Status: DC

## 2024-02-19 ENCOUNTER — Other Ambulatory Visit: Payer: Self-pay | Admitting: Family Medicine

## 2024-02-19 DIAGNOSIS — E1169 Type 2 diabetes mellitus with other specified complication: Secondary | ICD-10-CM

## 2024-03-08 ENCOUNTER — Other Ambulatory Visit: Payer: Self-pay | Admitting: Family Medicine

## 2024-03-08 DIAGNOSIS — E1169 Type 2 diabetes mellitus with other specified complication: Secondary | ICD-10-CM

## 2024-06-06 ENCOUNTER — Encounter: Payer: Self-pay | Admitting: Family Medicine

## 2024-06-06 ENCOUNTER — Ambulatory Visit: Admitting: Family Medicine

## 2024-06-06 VITALS — BP 128/85 | HR 107 | Temp 97.7°F | Ht 62.0 in | Wt 168.0 lb

## 2024-06-06 DIAGNOSIS — E785 Hyperlipidemia, unspecified: Secondary | ICD-10-CM

## 2024-06-06 DIAGNOSIS — I10 Essential (primary) hypertension: Secondary | ICD-10-CM

## 2024-06-06 DIAGNOSIS — Z7984 Long term (current) use of oral hypoglycemic drugs: Secondary | ICD-10-CM | POA: Diagnosis not present

## 2024-06-06 DIAGNOSIS — E1169 Type 2 diabetes mellitus with other specified complication: Secondary | ICD-10-CM

## 2024-06-06 DIAGNOSIS — Z720 Tobacco use: Secondary | ICD-10-CM | POA: Diagnosis not present

## 2024-06-06 DIAGNOSIS — Z1211 Encounter for screening for malignant neoplasm of colon: Secondary | ICD-10-CM

## 2024-06-06 DIAGNOSIS — E119 Type 2 diabetes mellitus without complications: Secondary | ICD-10-CM

## 2024-06-06 DIAGNOSIS — Z130 Encounter for screening for diseases of the blood and blood-forming organs and certain disorders involving the immune mechanism: Secondary | ICD-10-CM

## 2024-06-06 MED ORDER — AMLODIPINE BESYLATE 5 MG PO TABS: 5.0000 mg | ORAL_TABLET | Freq: Every day | ORAL | 3 refills | Status: AC

## 2024-06-06 MED ORDER — ROSUVASTATIN CALCIUM 10 MG PO TABS: 10.0000 mg | ORAL_TABLET | Freq: Every day | ORAL | 3 refills | Status: AC

## 2024-06-06 MED ORDER — METFORMIN HCL 500 MG PO TABS
500.0000 mg | ORAL_TABLET | Freq: Every day | ORAL | 3 refills | Status: DC
Start: 2024-06-06 — End: 2024-06-14

## 2024-06-06 MED ORDER — LISINOPRIL 20 MG PO TABS: 20.0000 mg | ORAL_TABLET | Freq: Every day | ORAL | 3 refills | Status: AC

## 2024-06-06 NOTE — Assessment & Plan Note (Signed)
 Stable. Reasonably well controlled. Medications refilled. Labs ordered.

## 2024-06-06 NOTE — Assessment & Plan Note (Signed)
 A1c to assess.  Continue metformin.

## 2024-06-06 NOTE — Progress Notes (Signed)
 Subjective:  Patient ID: [name], female    DOB: June 30, 1976  Age: 48 y.o. MRN: [id number]  CC:   Chief Complaint  Patient presents with   Diabetes   Hypertension    HPI:  48 year old female presents for follow up.  Patient still smoking.  She is not interested in cessation.  BP reasonably well-controlled.  Would like to see diastolic below 80.  Requesting refills on amlodipine  and lisinopril .  Last LDL was 81.  She is tolerating Crestor .  Requesting refill.  Needs labs.  Last A1c was elevated above goal.  Needs A1c today.    Patient declines immunizations today.  She is in need of colon cancer screening.  She is amenable to Cologuard.    Patient Active Problem List   Diagnosis Date Noted   Essential hypertension 10/21/2022   Tobacco abuse 04/07/2022   Hyperlipidemia associated with type 2 diabetes mellitus (HCC) 09/06/2020   Type 2 diabetes mellitus without complication, without long-term current use of insulin (HCC) 12/15/2017   Vitamin D  deficiency 12/10/2017    Social Hx   Social History   Socioeconomic History   Marital status: Married    Spouse name: Not on file   Number of children: Not on file   Years of education: Not on file   Highest education level: Not on file  Occupational History   Not on file  Tobacco Use   Smoking status: Every Day    Current packs/day: 0.50    Types: Cigarettes   Smokeless tobacco: Never  Vaping Use   Vaping status: Never Used  Substance and Sexual Activity   Alcohol use: No   Drug use: No   Sexual activity: Yes    Birth control/protection: Other-see comments    Comment: partner vasectomy  Other Topics Concern   Not on file  Social History Narrative   ** Merged History Encounter **       Social Drivers of Corporate investment banker Strain: Not on file  Food Insecurity: Not on file  Transportation Needs: Not on file  Physical Activity: Not on file  Stress: Not on file  Social Connections: Not on file     Review of Systems  Constitutional: Negative.   Respiratory: Negative.    Cardiovascular: Negative.     Objective:  BP 128/85   Pulse (!) 107   Temp 97.7 F (36.5 C)   Ht 5' 2 (1.575 m)   Wt 168 lb (76.2 kg)   SpO2 97%   BMI 30.73 kg/m      06/06/2024    1:13 PM 11/30/2023    1:02 PM 06/01/2023    1:08 PM  BP/Weight  Systolic BP 128 122 147  Diastolic BP 85 84 90  Wt. (Lbs) 168 170.6 169.2  BMI 30.73 kg/m2 31.2 kg/m2 30.95 kg/m2    Physical Exam Vitals and nursing note reviewed.  Constitutional:      General: She is not in acute distress.    Appearance: Normal appearance.  HENT:     Head: Normocephalic and atraumatic.  Eyes:     General:        Right eye: No discharge.        Left eye: No discharge.     Conjunctiva/sclera: Conjunctivae normal.  Cardiovascular:     Rate and Rhythm: Normal rate and regular rhythm.  Pulmonary:     Effort: Pulmonary effort is normal.     Breath sounds: Normal breath sounds. No wheezing,  rhonchi or rales.  Neurological:     Mental Status: She is alert.  Psychiatric:        Mood and Affect: Mood normal.        Behavior: Behavior normal.     Lab Results  Component Value Date   WBC 8.6 12/03/2023   HGB 15.5 12/03/2023   HCT 46.1 12/03/2023   PLT 188 12/03/2023   GLUCOSE 220 (H) 12/03/2023   CHOL 143 12/03/2023   TRIG 103 12/03/2023   HDL 43 12/03/2023   LDLCALC 81 12/03/2023   ALT 51 (H) 12/03/2023   AST 50 (H) 12/03/2023   NA 139 12/03/2023   K 4.1 12/03/2023   CL 100 12/03/2023   CREATININE 0.70 12/03/2023   BUN 12 12/03/2023   CO2 23 12/03/2023   TSH 1.390 02/17/2020   INR 1.0 10/18/2022   HGBA1C 7.5 (H) 12/03/2023     Assessment & Plan:  Essential hypertension Assessment & Plan: Stable. Reasonably well controlled. Medications refilled. Labs ordered.  Orders: -     amLODIPine  Besylate; Take 1 tablet (5 mg total) by mouth daily.  Dispense: 90 tablet; Refill: 3 -     Lisinopril ; Take 1 tablet (20 mg  total) by mouth daily.  Dispense: 90 tablet; Refill: 3  Type 2 diabetes mellitus without complication, without long-term current use of insulin (HCC) Assessment & Plan: A1c to assess.  Continue metformin .  Orders: -     metFORMIN  HCl; Take 1 tablet (500 mg total) by mouth daily. with food  Dispense: 90 tablet; Refill: 3 -     CMP14+EGFR -     Hemoglobin A1c -     Microalbumin / creatinine urine ratio  Hyperlipidemia associated with type 2 diabetes mellitus (HCC) Assessment & Plan: Stable.  Lipid panel to reassess.  Continue Crestor .  Orders: -     Rosuvastatin  Calcium ; Take 1 tablet (10 mg total) by mouth daily. for cholesterol.  Dispense: 90 tablet; Refill: 3 -     Lipid panel  Colon cancer screening -     Cologuard  Screening for deficiency anemia -     CBC  Tobacco abuse Assessment & Plan: Not ready to quit.     Follow-up:  6 months  Willistine Ferrall Bluford DO [facility]

## 2024-06-06 NOTE — Assessment & Plan Note (Signed)
 Not ready to quit.

## 2024-06-06 NOTE — Assessment & Plan Note (Signed)
 Stable.  Lipid panel to reassess.  Continue Crestor .

## 2024-06-06 NOTE — Patient Instructions (Signed)
 Labs at your convenience.  Call [phone] to schedule mammogram.  Meds refilled.  Follow up in 6 months.

## 2024-06-10 LAB — CMP14+EGFR
ALT: 52 IU/L — ABNORMAL HIGH (ref 0–32)
AST: 62 IU/L — ABNORMAL HIGH (ref 0–40)
Albumin: 4.1 g/dL (ref 3.9–4.9)
Alkaline Phosphatase: 76 IU/L (ref 41–116)
BUN/Creatinine Ratio: 20 (ref 9–23)
BUN: 12 mg/dL (ref 6–24)
Bilirubin Total: 0.6 mg/dL (ref 0.0–1.2)
CO2: 22 mmol/L (ref 20–29)
Calcium: 9.3 mg/dL (ref 8.7–10.2)
Chloride: 100 mmol/L (ref 96–106)
Creatinine, Ser: 0.59 mg/dL (ref 0.57–1.00)
Globulin, Total: 2.5 g/dL (ref 1.5–4.5)
Glucose: 189 mg/dL — ABNORMAL HIGH (ref 70–99)
Potassium: 3.7 mmol/L (ref 3.5–5.2)
Sodium: 134 mmol/L (ref 134–144)
Total Protein: 6.6 g/dL (ref 6.0–8.5)
eGFR: 112 mL/min/1.73 (ref >59)

## 2024-06-10 LAB — CBC
Hematocrit: 46.2 % (ref 34.0–46.6)
Hemoglobin: 15.7 g/dL (ref 11.1–15.9)
MCH: 32.1 pg (ref 26.6–33.0)
MCHC: 34 g/dL (ref 31.5–35.7)
MCV: 95 fL (ref 79–97)
Platelets: 160 x10E3/uL (ref 150–450)
RBC: 4.89 x10E6/uL (ref 3.77–5.28)
RDW: 12.6 % (ref 11.7–15.4)
WBC: 8.7 x10E3/uL (ref 3.4–10.8)

## 2024-06-10 LAB — LIPID PANEL
Chol/HDL Ratio: 3.4 ratio (ref 0.0–4.4)
Cholesterol, Total: 123 mg/dL (ref 100–199)
HDL: 36 mg/dL — ABNORMAL LOW (ref >39)
LDL Chol Calc (NIH): 68 mg/dL (ref 0–99)
Triglycerides: 99 mg/dL (ref 0–149)
VLDL Cholesterol Cal: 19 mg/dL (ref 5–40)

## 2024-06-10 LAB — MICROALBUMIN / CREATININE URINE RATIO
Creatinine, Urine: 218 mg/dL
Microalb/Creat Ratio: 11 mg/g{creat} (ref 0–29)
Microalbumin, Urine: 24.3 ug/mL

## 2024-06-10 LAB — HEMOGLOBIN A1C
Est. average glucose Bld gHb Est-mCnc: 235 mg/dL
Hgb A1c MFr Bld: 9.8 % — ABNORMAL HIGH (ref 4.8–5.6)

## 2024-06-12 ENCOUNTER — Ambulatory Visit: Payer: Self-pay | Admitting: Family Medicine

## 2024-06-14 MED ORDER — METFORMIN HCL 1000 MG PO TABS: 1000.0000 mg | ORAL_TABLET | Freq: Two times a day (BID) | ORAL | 3 refills | Status: AC

## 2024-06-14 NOTE — Telephone Encounter (Signed)
 Patient aware of results and recommendations.  Updated metformin  script sent to pharmacy.

## 2024-06-14 NOTE — Telephone Encounter (Signed)
-----   Message from Jacqulyn KANDICE Ahle sent at 06/12/2024 11:11 PM EDT ----- A1c has risen significantly. Recommend increase in Metformin  to 1000 mg BID. ----- Message ----- From: Interface, [facility] Lab Results In Sent: 06/10/2024   7:38 AM EDT To: Jayce G Cook, DO

## 2024-06-18 LAB — COLOGUARD: COLOGUARD: NEGATIVE

## 2024-06-21 NOTE — Telephone Encounter (Signed)
 Patient aware of results and recommendations.

## 2024-06-21 NOTE — Telephone Encounter (Signed)
-----   Message from Jacqulyn KANDICE Ahle sent at 06/19/2024  3:49 PM EDT ----- Cologuard negative.  ----- Message ----- From: Rebecka Memos Lab Results In Sent: 06/10/2024   7:38 AM EDT To: Jayce G Cook, DO

## 2024-12-06 ENCOUNTER — Encounter: Admitting: Family Medicine
